# Patient Record
Sex: Male | Born: 1955 | Race: White | Hispanic: No | Marital: Married | Smoking: Never smoker
Health system: Southern US, Community
[De-identification: ages and names within clinical notes are randomized; demographics above are authoritative.]

## PROBLEM LIST (undated history)

## (undated) DIAGNOSIS — J45909 Unspecified asthma, uncomplicated: Secondary | ICD-10-CM

## (undated) DIAGNOSIS — R7989 Other specified abnormal findings of blood chemistry: Secondary | ICD-10-CM

## (undated) DIAGNOSIS — R011 Cardiac murmur, unspecified: Secondary | ICD-10-CM

## (undated) DIAGNOSIS — R945 Abnormal results of liver function studies: Secondary | ICD-10-CM

## (undated) DIAGNOSIS — D489 Neoplasm of uncertain behavior, unspecified: Secondary | ICD-10-CM

## (undated) DIAGNOSIS — I10 Essential (primary) hypertension: Secondary | ICD-10-CM

## (undated) HISTORY — DX: Neoplasm of uncertain behavior, unspecified: D48.9

## (undated) HISTORY — DX: Other specified abnormal findings of blood chemistry: R79.89

## (undated) HISTORY — DX: Abnormal results of liver function studies: R94.5

## (undated) HISTORY — PX: OTHER SURGICAL HISTORY: SHX169

## (undated) HISTORY — DX: Cardiac murmur, unspecified: R01.1

## (undated) HISTORY — DX: Unspecified asthma, uncomplicated: J45.909

## (undated) HISTORY — DX: Essential (primary) hypertension: I10

---

## 1992-03-11 HISTORY — PX: VASECTOMY: SHX75

## 2010-03-11 LAB — HM COLONOSCOPY

## 2015-03-22 LAB — BASIC METABOLIC PANEL
BUN: 22 mg/dL — AB (ref 4–21)
CREATININE: 0.9 mg/dL (ref 0.6–1.3)
Glucose: 92 mg/dL
Potassium: 4.4 mmol/L (ref 3.4–5.3)
Sodium: 129 mmol/L — AB (ref 137–147)

## 2015-03-22 LAB — HEPATIC FUNCTION PANEL
ALK PHOS: 72 U/L (ref 25–125)
BILIRUBIN, TOTAL: 0.5 mg/dL

## 2015-03-22 LAB — PSA: PSA: 0.36

## 2015-03-22 LAB — TSH: TSH: 1.86 u[IU]/mL (ref 0.41–5.90)

## 2015-04-17 LAB — HEPATIC FUNCTION PANEL
ALT: 32 U/L (ref 10–40)
AST: 26 U/L (ref 14–40)
Alkaline Phosphatase: 59 U/L (ref 25–125)
Bilirubin, Total: 0.5 mg/dL

## 2015-04-17 LAB — BASIC METABOLIC PANEL
BUN: 17 mg/dL (ref 4–21)
Creatinine: 0.8 mg/dL (ref 0.6–1.3)
GLUCOSE: 101 mg/dL
Potassium: 4.1 mmol/L (ref 3.4–5.3)
SODIUM: 134 mmol/L — AB (ref 137–147)

## 2015-04-17 LAB — LIPID PANEL
CHOLESTEROL: 202 mg/dL — AB (ref 0–200)
HDL: 66 mg/dL (ref 35–70)
LDL Cholesterol: 126 mg/dL
LDL/HDL RATIO: 3.1
Triglycerides: 49 mg/dL (ref 40–160)

## 2015-05-01 ENCOUNTER — Other Ambulatory Visit: Payer: Self-pay

## 2015-05-01 ENCOUNTER — Encounter: Payer: Self-pay | Admitting: Internal Medicine

## 2015-05-02 ENCOUNTER — Ambulatory Visit (INDEPENDENT_AMBULATORY_CARE_PROVIDER_SITE_OTHER): Payer: BLUE CROSS/BLUE SHIELD | Admitting: Internal Medicine

## 2015-05-02 ENCOUNTER — Encounter: Payer: Self-pay | Admitting: Internal Medicine

## 2015-05-02 VITALS — BP 120/76 | HR 63 | Temp 97.8°F | Ht 72.0 in | Wt 246.5 lb

## 2015-05-02 DIAGNOSIS — Z0000 Encounter for general adult medical examination without abnormal findings: Secondary | ICD-10-CM

## 2015-05-02 DIAGNOSIS — J4520 Mild intermittent asthma, uncomplicated: Secondary | ICD-10-CM

## 2015-05-02 DIAGNOSIS — I10 Essential (primary) hypertension: Secondary | ICD-10-CM | POA: Diagnosis not present

## 2015-05-02 DIAGNOSIS — Z09 Encounter for follow-up examination after completed treatment for conditions other than malignant neoplasm: Secondary | ICD-10-CM

## 2015-05-02 DIAGNOSIS — Z7689 Persons encountering health services in other specified circumstances: Secondary | ICD-10-CM

## 2015-05-02 MED ORDER — ZALEPLON 10 MG PO CAPS: 10.0000 mg | ORAL_CAPSULE | Freq: Every evening | ORAL | Status: DC | PRN

## 2015-05-02 NOTE — Patient Instructions (Signed)
GO TO THE LAB IN 3 MONTHS A BMP @ [facility]  GO TO THE FRONT DESK  Schedule a complete physical exam to be done in 1 year Please be fasting     Check the  blood pressure 2 or 3 times a   Week  Be sure your blood pressure is between 110/65 and  145/85. If it is consistently higher or lower, let me know

## 2015-05-02 NOTE — Progress Notes (Signed)
Subjective:    Patient ID: Daniel Lyons, male    DOB: 1955-11-04, 60 y.o.   MRN: [id number]  DOS:  05/02/2015 Type of visit - description : New patient, transferring from [facility]  Interval history: Feeling well now but in the last few months had a number of issues: Developed a URI November 2016, was treated with a Z-Pak, prednisone and eventually in January 2017 with Augmentin. A chest x-ray was negative, he eventually got better. In the process, BP was noted to be elevated in the 150s/100 several times. Hydrochlorothiazide was switched to chlorthalidone. Good compliance, tolerance and BPs are now much better. Also, LFTs were elevated 3 normal values, in the context of being sick and taking a number of medications, subsequently LFTs were checked and they were normal. Again at this point he is feeling well.   Review of Systems   denies fever chills No cough or wheezing No nausea, vomiting, diarrhea.  Past Medical History  Diagnosis Date  . Hypertension   . Elevated LFTs   . Asthmatic bronchitis     "long ago"  . Giant cell tumor     removal  . Heart murmur     frequent PVCs    Past Surgical History  Procedure Laterality Date  . Vasectomy  1994  . Finger tumor removal      L index, giant cell tumor     Family History  Problem Relation Age of Onset  . Prostate cancer Father 5  . Prostate cancer Paternal Uncle   . CAD Neg Hx   . Diabetes Neg Hx   . Colon cancer Neg Hx   . Pancreatic cancer Father     Social History   Social History  . Marital Status: Married    Spouse Name: N/A  . Number of Children: 3  . Years of Education: N/A   Occupational History  . 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 Smoking status: Never Smoker   . Smokeless tobacco: Not on file  . Alcohol Use: 0.0 oz/week    0 Standard drinks or equivalent per week     Comment: 12 oz wine daily  . Drug Use: No  . Sexual Activity: Not on file   Other Topics  Concern  . Not on file   Social History Narrative        Medication List       This list is accurate as of: 05/02/15 11:59 PM.  Always use your most recent med list.               chlorthalidone 25 MG tablet  Commonly known as:  HYGROTON  Take 25 mg by mouth every morning.     fosinopril 20 MG tablet  Commonly known as:  MONOPRIL  Take 2 tablets by mouth daily.     Magnesium 250 MG Tabs  Take 1 tablet by mouth daily.     zaleplon 10 MG capsule  Commonly known as:  SONATA  Take 1 capsule (10 mg total) by mouth at bedtime as needed for sleep.           Objective:   Physical Exam BP 120/76 mmHg  Pulse 63  Temp(Src) 97.8 F (36.6 C) (Oral)  Ht 6' (1.829 m)  Wt 246 lb 8 oz (111.812 kg)  BMI 33.42 kg/m2  SpO2 98% General:   Well developed, well nourished . NAD.  HEENT:  Normocephalic . Face symmetric, atraumatic  Lungs:  CTA B Normal respiratory effort, no intercostal retractions, no accessory muscle use. Heart: RRR,  no murmur.  no pretibial edema bilaterally  Abdomen:  Not distended, soft, non-tender. No rebound or rigidity.  Skin: Not pale. Not jaundice Neurologic:  alert & oriented X3.  Speech normal, gait appropriate for age and unassisted Psych--  Cognition and judgment appear intact.  Cooperative with normal attention span and concentration.  Behavior appropriate. No anxious or depressed appearing.    Assessment & Plan:   Assessment HTN -on fosinopril for a while, HCTZ change to chlorthalidone 03-2015. H/o RAD   H/o freq PVCs, dx remotely, holter in the 90s  Labs reviewed today: 03/22/2015: PSA 0.3, TSH 1.8, potassium 4.4, creatinine 0.9, AST 132, ALT 206 04/17/2015: Cholesterol 202, LDL 126. HDL 66 Sodium 134 low, chloride 94 low, creatinine 0.8, potassium 4.1. AST 26, ALT 32 testosterone 456, free testosterone 62 normal  PLAN Transferring from Dr.Jobe @ [facility] HTN:on fosinopril for a while, HCTZ changed to chlorthalidone 03-2015  due to elevated BP, subsequent BMP satisfactory except for mild hyponatremia, BPs now are very good. We'll continue with present care, check a BMP-Mg in 3 months Reactive airway disease: Recently developed a URI, s/p 2 rounds of antibiotics and prednisone >>> better. FH prostate cancer: Most recent PSA normal. Primary care: Td 07/2014 Colonoscopy 2012 reportedly normal Had a flu shot.  RTC 1 year  Today, I spent more than  30 min with the patient: >50% of the time counseling regards chronic medical issues. Also: -reviewing the chart and labs ordered by other providers

## 2015-05-02 NOTE — Progress Notes (Signed)
Pre visit review using our clinic review tool, if applicable. No additional management support is needed unless otherwise documented below in the visit note. 

## 2015-05-03 ENCOUNTER — Encounter: Payer: Self-pay | Admitting: Internal Medicine

## 2015-05-03 DIAGNOSIS — I10 Essential (primary) hypertension: Secondary | ICD-10-CM | POA: Insufficient documentation

## 2015-05-03 DIAGNOSIS — J45909 Unspecified asthma, uncomplicated: Secondary | ICD-10-CM | POA: Insufficient documentation

## 2015-05-03 DIAGNOSIS — Z09 Encounter for follow-up examination after completed treatment for conditions other than malignant neoplasm: Secondary | ICD-10-CM | POA: Insufficient documentation

## 2015-05-03 DIAGNOSIS — Z0000 Encounter for general adult medical examination without abnormal findings: Secondary | ICD-10-CM | POA: Insufficient documentation

## 2015-05-03 NOTE — Assessment & Plan Note (Signed)
Transferring from [place] @ [facility] HTN:on fosinopril for a while, HCTZ changed to chlorthalidone 03-2015 due to elevated BP, subsequent BMP satisfactory except for mild hyponatremia, BPs now are very good. We'll continue with present care, check a BMP-Mg in 3 months Reactive airway disease: Recently developed a URI, s/p 2 rounds of antibiotics and prednisone >>> better. FH prostate cancer: Most recent PSA normal. Primary care: Td 07/2014 Colonoscopy 2012 reportedly normal Had a flu shot.  RTC 1 year

## 2015-05-03 NOTE — Assessment & Plan Note (Signed)
FH prostate cancer: Most recent PSA normal. Primary care: Td 07/2014 Colonoscopy 2012 reportedly normal Had a flu shot.

## 2015-06-13 ENCOUNTER — Ambulatory Visit (INDEPENDENT_AMBULATORY_CARE_PROVIDER_SITE_OTHER): Payer: BLUE CROSS/BLUE SHIELD | Admitting: Physician Assistant

## 2015-06-13 ENCOUNTER — Encounter: Payer: Self-pay | Admitting: Physician Assistant

## 2015-06-13 VITALS — BP 150/90 | HR 81 | Temp 98.1°F | Ht 72.0 in | Wt 245.0 lb

## 2015-06-13 DIAGNOSIS — B9689 Other specified bacterial agents as the cause of diseases classified elsewhere: Secondary | ICD-10-CM | POA: Insufficient documentation

## 2015-06-13 DIAGNOSIS — J019 Acute sinusitis, unspecified: Secondary | ICD-10-CM

## 2015-06-13 MED ORDER — AMOXICILLIN-POT CLAVULANATE 875-125 MG PO TABS: 1.0000 | ORAL_TABLET | Freq: Two times a day (BID) | ORAL | Status: DC

## 2015-06-13 NOTE — Assessment & Plan Note (Signed)
Rx Augmentin.  Increase fluids.  Rest.  Saline nasal spray.  Probiotic.  Mucinex as directed.  Humidifier in bedroom. Continue allergy medications.  Call or return to clinic if symptoms are not improving.  

## 2015-06-13 NOTE — Progress Notes (Signed)
Pre visit review using our clinic review tool, if applicable. No additional management support is needed unless otherwise documented below in the visit note. 

## 2015-06-13 NOTE — Progress Notes (Signed)
Patient presents to clinic today c/o 8 days of worsening sinus pressure, ear pressure, thick rhinorrhea and sore throat. Endorses facial pain and tooth pain with significant L sided sinus pain over the past couple of days. Denies fever, chest congestion. Has taken Motrin without much relief of symptoms.   Past Medical History  Diagnosis Date  . Hypertension   . Elevated LFTs   . Reactive airway disease     "long ago"  . Giant cell tumor     removal  . Heart murmur     frequent PVCs    Current Outpatient Prescriptions on File Prior to Visit  Medication Sig Dispense Refill  . chlorthalidone (HYGROTON) 25 MG tablet Take 25 mg by mouth every morning.    . fosinopril (MONOPRIL) 20 MG tablet Take 2 tablets by mouth daily.    . Magnesium 250 MG TABS Take 1 tablet by mouth daily.    . zaleplon (SONATA) 10 MG capsule Take 1 capsule (10 mg total) by mouth at bedtime as needed for sleep. 15 capsule 0   No current facility-administered medications on file prior to visit.    No Known Allergies  Family History  Problem Relation Age of Onset  . Prostate cancer Father 25  . Prostate cancer Paternal Uncle   . CAD Neg Hx   . Diabetes Neg Hx   . Colon cancer Neg Hx   . Pancreatic cancer Father     Social History   Social History  . Marital Status: Married    Spouse Name: N/A  . Number of Children: 3  . 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 Smoking status: Never Smoker   . Smokeless tobacco: None  . Alcohol Use: 0.0 oz/week    0 Standard drinks or equivalent per week     Comment: 12 oz wine daily  . Drug Use: No  . Sexual Activity: Not Asked   Other Topics Concern  . None   Social History Narrative   Review of Systems - See HPI.  All other ROS are negative.  BP 150/90 mmHg  Pulse 81  Temp(Src) 98.1 F (36.7 C) (Oral)  Ht 6' (1.829 m)  Wt 245 lb (111.131 kg)  BMI 33.22 kg/m2  SpO2  98%  Physical Exam  Constitutional: He is oriented to person, place, and time and well-developed, well-nourished, and in no distress.  HENT:  Head: Normocephalic and atraumatic.  Right Ear: Tympanic membrane normal.  Left Ear: Tympanic membrane normal.  Nose: Mucosal edema and rhinorrhea present. Left sinus exhibits maxillary sinus tenderness and frontal sinus tenderness.  Mouth/Throat: Uvula is midline, oropharynx is clear and moist and mucous membranes are normal.  Eyes: Conjunctivae are normal.  Neck: Neck supple.  Cardiovascular: Normal rate, regular rhythm, normal heart sounds and intact distal pulses.   Pulmonary/Chest: Effort normal and breath sounds normal.  Lymphadenopathy:    He has no cervical adenopathy.  Neurological: He is alert and oriented to person, place, and time.  Skin: Skin is warm and dry. No rash noted.  Psychiatric: Affect normal.  Vitals reviewed.   Recent Results (from the past 2160 hour(s))  Basic metabolic panel     Status: Abnormal   Collection Time: 03/22/15 12:00 AM  Result Value Ref Range   Glucose 92 mg/dL   BUN 22 (A) 4 - 21 mg/dL   Creatinine 0.9 0.6 - 1.3 mg/dL  Potassium 4.4 3.4 - 5.3 mmol/L   Sodium 129 (A) 137 - 147 mmol/L  Hepatic function panel     Status: None   Collection Time: 03/22/15 12:00 AM  Result Value Ref Range   Alkaline Phosphatase 72 25 - 125 U/L   Bilirubin, Total 0.5 mg/dL  PSA     Status: None   Collection Time: 03/22/15 12:00 AM  Result Value Ref Range   PSA 0.36   TSH     Status: None   Collection Time: 03/22/15 12:00 AM  Result Value Ref Range   TSH 1.86 0.41 - 5.90 uIU/mL  Basic metabolic panel     Status: Abnormal   Collection Time: 04/17/15 12:00 AM  Result Value Ref Range   Glucose 101 mg/dL   BUN 17 4 - 21 mg/dL   Creatinine 0.8 0.6 - 1.3 mg/dL   Potassium 4.1 3.4 - 5.3 mmol/L   Sodium 134 (A) 137 - 147 mmol/L  Lipid panel     Status: Abnormal   Collection Time: 04/17/15 12:00 AM  Result Value Ref  Range   LDl/HDL Ratio 3.1    Triglycerides 49 40 - 160 mg/dL   Cholesterol 202 (A) 0 - 200 mg/dL   HDL 66 35 - 70 mg/dL   LDL Cholesterol 126 mg/dL  Hepatic function panel     Status: None   Collection Time: 04/17/15 12:00 AM  Result Value Ref Range   Alkaline Phosphatase 59 25 - 125 U/L   ALT 32 10 - 40 U/L   AST 26 14 - 40 U/L   Bilirubin, Total 0.5 mg/dL    Assessment/Plan: Acute bacterial sinusitis Rx Augmentin.  Increase fluids.  Rest.  Saline nasal spray.  Probiotic.  Mucinex as directed.  Humidifier in bedroom. Continue allergy medications.  Call or return to clinic if symptoms are not improving.

## 2015-06-13 NOTE — Patient Instructions (Signed)
Please take antibiotic as directed.  Increase fluid intake.  Use Saline nasal spray.  Take a daily multivitamin. Continue allergy medication regimen.  Place a humidifier in the bedroom.  Please call or return clinic if symptoms are not improving.  Sinusitis Sinusitis is redness, soreness, and swelling (inflammation) of the paranasal sinuses. Paranasal sinuses are air pockets within the bones of your face (beneath the eyes, the middle of the forehead, or above the eyes). In healthy paranasal sinuses, mucus is able to drain out, and air is able to circulate through them by way of your nose. However, when your paranasal sinuses are inflamed, mucus and air can become trapped. This can allow bacteria and other germs to grow and cause infection. Sinusitis can develop quickly and last only a short time (acute) or continue over a long period (chronic). Sinusitis that lasts for more than 12 weeks is considered chronic.  CAUSES  Causes of sinusitis include:  Allergies.  Structural abnormalities, such as displacement of the cartilage that separates your nostrils (deviated septum), which can decrease the air flow through your nose and sinuses and affect sinus drainage.  Functional abnormalities, such as when the small hairs (cilia) that line your sinuses and help remove mucus do not work properly or are not present. SYMPTOMS  Symptoms of acute and chronic sinusitis are the same. The primary symptoms are pain and pressure around the affected sinuses. Other symptoms include:  Upper toothache.  Earache.  Headache.  Bad breath.  Decreased sense of smell and taste.  A cough, which worsens when you are lying flat.  Fatigue.  Fever.  Thick drainage from your nose, which often is green and may contain pus (purulent).  Swelling and warmth over the affected sinuses. DIAGNOSIS  Your caregiver will perform a physical exam. During the exam, your caregiver may:  Look in your nose for signs of abnormal  growths in your nostrils (nasal polyps).  Tap over the affected sinus to check for signs of infection.  View the inside of your sinuses (endoscopy) with a special imaging device with a light attached (endoscope), which is inserted into your sinuses. If your caregiver suspects that you have chronic sinusitis, one or more of the following tests may be recommended:  Allergy tests.  Nasal culture A sample of mucus is taken from your nose and sent to a lab and screened for bacteria.  Nasal cytology A sample of mucus is taken from your nose and examined by your caregiver to determine if your sinusitis is related to an allergy. TREATMENT  Most cases of acute sinusitis are related to a viral infection and will resolve on their own within 10 days. Sometimes medicines are prescribed to help relieve symptoms (pain medicine, decongestants, nasal steroid sprays, or saline sprays).  However, for sinusitis related to a bacterial infection, your caregiver will prescribe antibiotic medicines. These are medicines that will help kill the bacteria causing the infection.  Rarely, sinusitis is caused by a fungal infection. In theses cases, your caregiver will prescribe antifungal medicine. For some cases of chronic sinusitis, surgery is needed. Generally, these are cases in which sinusitis recurs more than 3 times per year, despite other treatments. HOME CARE INSTRUCTIONS   Drink plenty of water. Water helps thin the mucus so your sinuses can drain more easily.  Use a humidifier.  Inhale steam 3 to 4 times a day (for example, sit in the bathroom with the shower running).  Apply a warm, moist washcloth to your face 3 to  4 times a day, or as directed by your caregiver.  Use saline nasal sprays to help moisten and clean your sinuses.  Take over-the-counter or prescription medicines for pain, discomfort, or fever only as directed by your caregiver. SEEK IMMEDIATE MEDICAL CARE IF:  You have increasing pain or  severe headaches.  You have nausea, vomiting, or drowsiness.  You have swelling around your face.  You have vision problems.  You have a stiff neck.  You have difficulty breathing. MAKE SURE YOU:   Understand these instructions.  Will watch your condition.  Will get help right away if you are not doing well or get worse. Document Released: 02/25/2005 Document Revised: 05/20/2011 Document Reviewed: 03/12/2011 [facility] Patient Information 2014 [place], [place].

## 2015-06-13 NOTE — Progress Notes (Deleted)
Pre visit review using our clinic review tool, if applicable. No additional management support is needed unless otherwise documented below in the visit note. 

## 2015-09-18 ENCOUNTER — Telehealth: Payer: Self-pay | Admitting: Internal Medicine

## 2015-09-18 DIAGNOSIS — L281 Prurigo nodularis: Secondary | ICD-10-CM | POA: Diagnosis not present

## 2015-09-18 DIAGNOSIS — D225 Melanocytic nevi of trunk: Secondary | ICD-10-CM | POA: Diagnosis not present

## 2015-09-18 DIAGNOSIS — D2262 Melanocytic nevi of left upper limb, including shoulder: Secondary | ICD-10-CM | POA: Diagnosis not present

## 2015-09-18 DIAGNOSIS — D485 Neoplasm of uncertain behavior of skin: Secondary | ICD-10-CM | POA: Diagnosis not present

## 2015-09-18 DIAGNOSIS — D2261 Melanocytic nevi of right upper limb, including shoulder: Secondary | ICD-10-CM | POA: Diagnosis not present

## 2015-09-18 NOTE — Telephone Encounter (Signed)
Relation to [id number] Call back [place]: [place] [address] - [place], [address] [phone] (Phone) [phone] (Fax)         Reason for call:  Patient requesting a refill chlorthalidone (HYGROTON) 25 MG tablet & fosinopril (MONOPRIL) 20 MG tablet

## 2015-09-18 NOTE — Telephone Encounter (Signed)
New Pt established on 05/02/2015, okay to refill medications?

## 2015-09-19 ENCOUNTER — Other Ambulatory Visit: Payer: Self-pay | Admitting: Internal Medicine

## 2015-09-19 MED ORDER — FOSINOPRIL SODIUM 20 MG PO TABS: 40.0000 mg | ORAL_TABLET | Freq: Every day | ORAL | Status: DC

## 2015-09-19 MED ORDER — CHLORTHALIDONE 25 MG PO TABS: 25.0000 mg | ORAL_TABLET | ORAL | Status: DC

## 2015-09-19 NOTE — Telephone Encounter (Signed)
Rx's sent. LMOM informing Pt that 30 day supply of Rx's were sent to pharmacy but no further refills can be given until labs from [place] in 04/2015 are completed. Instructed him to call and schedule lab appt.

## 2015-09-19 NOTE — Telephone Encounter (Signed)
Okay  one month, no RFs. Needs labs @ [facility] (see last office visit) Further refills based on labs

## 2015-09-22 ENCOUNTER — Other Ambulatory Visit: Payer: BLUE CROSS/BLUE SHIELD

## 2015-09-28 ENCOUNTER — Other Ambulatory Visit: Payer: Self-pay

## 2015-09-28 DIAGNOSIS — I10 Essential (primary) hypertension: Secondary | ICD-10-CM

## 2015-09-29 ENCOUNTER — Other Ambulatory Visit: Payer: BLUE CROSS/BLUE SHIELD

## 2015-09-29 ENCOUNTER — Other Ambulatory Visit (INDEPENDENT_AMBULATORY_CARE_PROVIDER_SITE_OTHER): Payer: BLUE CROSS/BLUE SHIELD

## 2015-09-29 DIAGNOSIS — I10 Essential (primary) hypertension: Secondary | ICD-10-CM

## 2015-09-29 LAB — BASIC METABOLIC PANEL
BUN: 20 mg/dL (ref 6–23)
CALCIUM: 9.7 mg/dL (ref 8.4–10.5)
CHLORIDE: 94 meq/L — AB (ref 96–112)
CO2: 29 meq/L (ref 19–32)
CREATININE: 0.93 mg/dL (ref 0.40–1.50)
GFR: 88.56 mL/min (ref >60.00)
Glucose, Bld: 97 mg/dL (ref 70–99)
Potassium: 4.1 mEq/L (ref 3.5–5.1)
Sodium: 133 mEq/L — ABNORMAL LOW (ref 135–145)

## 2015-09-29 LAB — MAGNESIUM: Magnesium: 2.2 mg/dL (ref 1.5–2.5)

## 2015-10-06 ENCOUNTER — Other Ambulatory Visit: Payer: Self-pay | Admitting: Internal Medicine

## 2015-11-28 DIAGNOSIS — D225 Melanocytic nevi of trunk: Secondary | ICD-10-CM | POA: Diagnosis not present

## 2015-11-28 DIAGNOSIS — L988 Other specified disorders of the skin and subcutaneous tissue: Secondary | ICD-10-CM | POA: Diagnosis not present

## 2015-11-28 DIAGNOSIS — D485 Neoplasm of uncertain behavior of skin: Secondary | ICD-10-CM | POA: Diagnosis not present

## 2015-12-12 DIAGNOSIS — Z23 Encounter for immunization: Secondary | ICD-10-CM | POA: Diagnosis not present

## 2016-03-15 LAB — BASIC METABOLIC PANEL
BUN: 16 mg/dL (ref 4–21)
Creatinine: 0.9 mg/dL (ref 0.6–1.3)
GLUCOSE: 87 mg/dL
POTASSIUM: 3.8 mmol/L (ref 3.4–5.3)
SODIUM: 132 mmol/L — AB (ref 137–147)

## 2016-03-15 LAB — PSA: PSA: 0.5

## 2016-03-22 ENCOUNTER — Ambulatory Visit (INDEPENDENT_AMBULATORY_CARE_PROVIDER_SITE_OTHER): Payer: BLUE CROSS/BLUE SHIELD | Admitting: Internal Medicine

## 2016-03-22 ENCOUNTER — Encounter: Payer: Self-pay | Admitting: Internal Medicine

## 2016-03-22 VITALS — BP 122/74 | HR 76 | Temp 98.2°F | Resp 14 | Ht 72.0 in | Wt 242.1 lb

## 2016-03-22 DIAGNOSIS — Z114 Encounter for screening for human immunodeficiency virus [HIV]: Secondary | ICD-10-CM

## 2016-03-22 DIAGNOSIS — Z1159 Encounter for screening for other viral diseases: Secondary | ICD-10-CM | POA: Diagnosis not present

## 2016-03-22 DIAGNOSIS — Z0000 Encounter for general adult medical examination without abnormal findings: Secondary | ICD-10-CM | POA: Diagnosis not present

## 2016-03-22 LAB — CBC WITH DIFFERENTIAL/PLATELET
BASOS PCT: 0.5 % (ref 0.0–3.0)
Basophils Absolute: 0 10*3/uL (ref 0.0–0.1)
EOS ABS: 0.3 10*3/uL (ref 0.0–0.7)
Eosinophils Relative: 6.6 % — ABNORMAL HIGH (ref 0.0–5.0)
HEMATOCRIT: 41 % (ref 39.0–52.0)
Hemoglobin: 14.1 g/dL (ref 13.0–17.0)
LYMPHS ABS: 0.9 10*3/uL (ref 0.7–4.0)
Lymphocytes Relative: 22.9 % (ref 12.0–46.0)
MCHC: 34.5 g/dL (ref 30.0–36.0)
MCV: 88.2 fl (ref 78.0–100.0)
MONO ABS: 0.7 10*3/uL (ref 0.1–1.0)
Monocytes Relative: 18.6 % — ABNORMAL HIGH (ref 3.0–12.0)
NEUTROS ABS: 2 10*3/uL (ref 1.4–7.7)
NEUTROS PCT: 51.4 % (ref 43.0–77.0)
PLATELETS: 276 10*3/uL (ref 150.0–400.0)
RBC: 4.64 Mil/uL (ref 4.22–5.81)
RDW: 12.2 % (ref 11.5–15.5)
WBC: 3.8 10*3/uL — ABNORMAL LOW (ref 4.0–10.5)

## 2016-03-22 LAB — FOLATE: Folate: 22.4 ng/mL (ref >5.9)

## 2016-03-22 LAB — URINALYSIS, ROUTINE W REFLEX MICROSCOPIC
Bilirubin Urine: NEGATIVE
Hgb urine dipstick: NEGATIVE
Ketones, ur: NEGATIVE
Leukocytes, UA: NEGATIVE
Nitrite: NEGATIVE
PH: 7.5 (ref 5.0–8.0)
RBC / HPF: NONE SEEN (ref 0–?)
SPECIFIC GRAVITY, URINE: 1.015 (ref 1.000–1.030)
Total Protein, Urine: NEGATIVE
Urine Glucose: NEGATIVE
Urobilinogen, UA: 0.2 (ref 0.0–1.0)

## 2016-03-22 LAB — LIPID PANEL
CHOL/HDL RATIO: 3
CHOLESTEROL: 179 mg/dL (ref 0–200)
HDL: 54.2 mg/dL (ref >39.00)
LDL Cholesterol: 114 mg/dL — ABNORMAL HIGH (ref 0–99)
NonHDL: 125.13
TRIGLYCERIDES: 55 mg/dL (ref 0.0–149.0)
VLDL: 11 mg/dL (ref 0.0–40.0)

## 2016-03-22 LAB — AST: AST: 23 U/L (ref 0–37)

## 2016-03-22 LAB — TSH: TSH: 1.1 u[IU]/mL (ref 0.35–4.50)

## 2016-03-22 LAB — HEPATITIS C ANTIBODY: HCV Ab: NEGATIVE

## 2016-03-22 LAB — HIV ANTIBODY (ROUTINE TESTING W REFLEX): HIV: NONREACTIVE

## 2016-03-22 LAB — ALT: ALT: 29 U/L (ref 0–53)

## 2016-03-22 LAB — VITAMIN B12: Vitamin B-12: 321 pg/mL (ref 211–911)

## 2016-03-22 MED ORDER — SILDENAFIL CITRATE 20 MG PO TABS: 60.0000 mg | ORAL_TABLET | Freq: Every evening | ORAL | 3 refills | Status: DC | PRN

## 2016-03-22 NOTE — Assessment & Plan Note (Signed)
Td 07/2014; had a flu shot CCS: Colonoscopy 2012 reportedly normal FH Prostate ca-- last  PSA 0.3  (03-2015) Labs last week: BMP -- normal creatinine, Na 132 slightly low. PSA, FSH, LH normal. Free and total testosterone within normal, free testosterone value lower than last year. Will check a CBC, folic acid, vitamin D, B12, TSH, LFTs and FLP. Cont healthy lifestyle

## 2016-03-22 NOTE — Progress Notes (Signed)
Pre visit review using our clinic review tool, if applicable. No additional management support is needed unless otherwise documented below in the visit note. 

## 2016-03-22 NOTE — Progress Notes (Signed)
Subjective:    Patient ID: Daniel Lyons, male    DOB: 1955-06-28, 61 y.o.   MRN: [id number]  DOS:  03/22/2016 Type of visit - description : cpx Interval history: Has few concerns, see review of systems   Review of Systems -About 4 months ago he started to feel tired, he hired a Physiological scientist and started a formal exercise program several times a week, however by the  end of the week he was feeling so fatigued that had to scale down the amount of exercise he does . Has changed his diet, eating approximately 1700 cal/day. No weight loss but has lose some inches in the waist. -Also, noted that after ejaculation he is able to get more fluid if he pushes in the perineum. No blood in the semen, no other GU symptoms. In the last few weeks has noted that erections don't last long enough. Denies any particular stress, no new medicines (changes HCTZ to chlorthalidone a year ago). Denies chest pain, claudication - got labs last week. BMP satisfactory, FSH, LH and PSA normal. Testosterone within normal. Free testosterone at the lower level of normal but interestingly is less than previous levels a year ago.  -Occasionally snores when he sleeps in his back, not feeling sleepy -No anxiety, depression or stress. -Otherwise  13-system ROS  is negative   Past Medical History:  Diagnosis Date  . Elevated LFTs   . Giant cell tumor    removal  . Heart murmur    frequent PVCs  . Hypertension   . Reactive airway disease    "long ago"    Past Surgical History:  Procedure Laterality Date  . finger tumor removal     L index, giant cell tumor   . VASECTOMY  1994    Social History   Social History  . Marital status: Married    Spouse name: N/A  . Number of children: 3  . Years of education: N/A   Occupational History  . 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 Smoking status: Never Smoker  . Smokeless tobacco: Never Used  . Alcohol use 0.0 oz/week     Comment:  12 oz wine daily  . Drug use: No  . Sexual activity: Not on file   Other Topics Concern  . Not on file   Social History Narrative  . No narrative on file     Family History  Problem Relation Age of Onset  . Prostate cancer Father 26  . Pancreatic cancer Father   . Prostate cancer Paternal Uncle   . CAD Neg Hx   . Diabetes Neg Hx   . Colon cancer Neg Hx      Allergies as of 03/22/2016   No Known Allergies     Medication List       Accurate as of 03/22/16 11:59 PM. Always use your most recent med list.          chlorthalidone 25 MG tablet Commonly known as:  HYGROTON Take 1 tablet (25 mg total) by mouth daily.   fosinopril 20 MG tablet Commonly known as:  MONOPRIL Take 2 tablets (40 mg total) by mouth daily.   sildenafil 20 MG tablet Commonly known as:  REVATIO Take 3-4 tablets (60-80 mg total) by mouth at bedtime as needed.   VITAMIN D3 PO Take 1 capsule by mouth daily.   zaleplon 10 MG capsule Commonly known as:  SONATA Take 1 capsule (  10 mg total) by mouth at bedtime as needed for sleep.          Objective:   Physical Exam BP 122/74 (BP Location: Left Arm, Patient Position: Sitting, Cuff Size: Normal)   Pulse 76   Temp 98.2 F (36.8 C) (Oral)   Resp 14   Ht 6' (1.829 m)   Wt 242 lb 2 oz (109.8 kg)   SpO2 98%   BMI 32.84 kg/m   General:   Well developed, well nourished . NAD.  Neck: No  thyromegaly  HEENT:  Normocephalic . Face symmetric, atraumatic Lungs:  CTA B Normal respiratory effort, no intercostal retractions, no accessory muscle use. Heart: RRR,  no murmur.  No pretibial edema bilaterally  Abdomen:  Not distended, soft, non-tender. No rebound or rigidity.   Lower extremities:   normal femoral pulses Rectal:  External abnormalities: none. Normal sphincter tone. No rectal masses or tenderness.  Stool brown  GU: Normal testicles-symmetric Prostate: Prostate gland firm and smooth, no enlargement, nodularity, tenderness, mass,  asymmetry or induration.  Skin: Exposed areas without rash. Not pale. Not jaundice Neurologic:  alert & oriented X3.  Speech normal, gait appropriate for age and unassisted Strength symmetric and appropriate for age.  Psych: Cognition and judgment appear intact.  Cooperative with normal attention span and concentration.  Behavior appropriate. No anxious or depressed appearing.    Assessment & Plan:   Assessment HTN -on fosinopril for a while, HCTZ change to chlorthalidone 03-2015. 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 ED: No anxiety or depression, no new medications except for chlorthalidone for one year. Normal vascular exam. Recommend a trial with Viagra Abnormal ejaculation? GU exam normal, recommend observation. Fatigue: Snores but denies feeling sleepy. Will check a CBC, folic acid, TSH, vitamin D, B12, LFTs. Reassess in 3 months. Consider further eval. Patient concerned about his testosterone results, free testosterone value within normal but lower than previous years. He wonders about treatment, I recommend observation for now, offered Endo referral. Will call if interested RTC 4 months

## 2016-03-22 NOTE — Patient Instructions (Signed)
GO TO THE LAB : Get the blood work     GO TO THE FRONT DESK Schedule your next appointment for a  Check up in 4 months   

## 2016-03-24 NOTE — Assessment & Plan Note (Signed)
HTN: Seems well-controlled on Monopril and chlorthalidone. ED: No anxiety or depression, no new medications except for chlorthalidone for one year. Normal vascular exam. Recommend a trial with Viagra Abnormal ejaculation? GU exam normal, recommend observation. Fatigue: Snores but denies feeling sleepy. Will check a CBC, folic acid, TSH, vitamin D, B12, LFTs. Reassess in 3 months. Consider further eval. Patient concerned about his testosterone results, free testosterone value within normal but lower than previous years. He wonders about treatment, I recommend observation for now, offered Endo referral. Will call if interested RTC 4 months

## 2016-03-26 LAB — VITAMIN D 1,25 DIHYDROXY
VITAMIN D3 1, 25 (OH): 51 pg/mL
Vitamin D 1, 25 (OH)2 Total: 51 pg/mL (ref 18–72)
Vitamin D2 1, 25 (OH)2: <8 pg/mL

## 2016-03-28 ENCOUNTER — Encounter: Payer: Self-pay | Admitting: Internal Medicine

## 2016-03-28 DIAGNOSIS — R7989 Other specified abnormal findings of blood chemistry: Secondary | ICD-10-CM

## 2016-03-29 NOTE — Telephone Encounter (Signed)
Referral placed.

## 2016-03-29 NOTE — Telephone Encounter (Signed)
Enter a endocrinology referral DX low testosterone  Received: Today  [place], MD  Damita Dunnings, CMA

## 2016-04-09 ENCOUNTER — Other Ambulatory Visit: Payer: Self-pay | Admitting: Internal Medicine

## 2016-04-11 ENCOUNTER — Encounter: Payer: Self-pay | Admitting: Internal Medicine

## 2016-04-11 ENCOUNTER — Other Ambulatory Visit: Payer: Self-pay | Admitting: Internal Medicine

## 2016-04-11 MED ORDER — ALBUTEROL SULFATE HFA 108 (90 BASE) MCG/ACT IN AERS: 2.0000 | INHALATION_SPRAY | Freq: Four times a day (QID) | RESPIRATORY_TRACT | 1 refills | Status: DC | PRN

## 2016-04-19 ENCOUNTER — Ambulatory Visit: Payer: BLUE CROSS/BLUE SHIELD | Admitting: Endocrinology

## 2016-04-24 ENCOUNTER — Encounter: Payer: BLUE CROSS/BLUE SHIELD | Admitting: Internal Medicine

## 2016-05-11 NOTE — Progress Notes (Signed)
[facility] [address] [place], [place] [address] Phone: [phone] Subjective:    I'm seeing this patient by the request  of:  Kathlene November, MD   CC: right shoulder pain.   [id number]  Daniel Lyons is a 61 y.o. male coming in with complaint of right shoulder Pain. This is been icing long time. Seems to be significant worse over the last 6 months. Patient states that certain movements causes a sharp pain. Seems to stay localized. Denies any radiation down the arm. Patient states though that he hasn't unable to do heavy lifting secondary to the pain. Sometimes can be painful enough that wakes him up at night. Rates the severity of pain a 7 out of 10. Does respond to anti-inflammatories.     Past Medical History:  Diagnosis Date  . Elevated LFTs   . Giant cell tumor    removal  . Heart murmur    frequent PVCs  . Hypertension   . Reactive airway disease    "long ago"   Past Surgical History:  Procedure Laterality Date  . finger tumor removal     L index, giant cell tumor   . VASECTOMY  1994   Social History   Social History  . Marital status: Married    Spouse name: N/A  . Number of children: 3  . Years of education: N/A   Occupational History  . nurse anesthesist  @ [employer]    Social History Main Topics  . Smoking status: Never Smoker  . Smokeless tobacco: Never Used  . Alcohol use 0.0 oz/week     Comment: 12 oz wine daily  . Drug use: No  . Sexual activity: Not on file   Other Topics Concern  . Not on file   Social History Narrative  . No narrative on file   No Known Allergies Family History  Problem Relation Age of Onset  . Prostate cancer Father 46  . Pancreatic cancer Father   . Prostate cancer Paternal Uncle   . CAD Neg Hx   . Diabetes Neg Hx   . Colon cancer Neg Hx     Past medical history, social, surgical and family history all reviewed in electronic medical record.  No pertanent information unless stated  regarding to the chief complaint.   Review of Systems:Review of systems updated and as accurate as of 05/11/16  No headache, visual changes, nausea, vomiting, diarrhea, constipation, dizziness, abdominal pain, skin rash, fevers, chills, night sweats, weight loss, swollen lymph nodes, body aches, joint swelling, muscle aches, chest pain, shortness of breath, mood changes.   Objective  There were no vitals taken for this visit. Systems examined below as of 05/11/16   General: No apparent distress alert and oriented x3 mood and affect normal, dressed appropriately.  HEENT: Pupils equal, extraocular movements intact  Respiratory: Patient's speak in full sentences and does not appear short of breath  Cardiovascular: No lower extremity edema, non tender, no erythema  Skin: Warm dry intact with no signs of infection or rash on extremities or on axial skeleton.  Abdomen: Soft nontender  Neuro: Cranial nerves II through XII are intact, neurovascularly intact in all extremities with 2+ DTRs and 2+ pulses.  Lymph: No lymphadenopathy of posterior or anterior cervical chain or axillae bilaterally.  Gait normal with good balance and coordination.  MSK:  Non tender with full range of motion and good stability and symmetric strength and tone of elbows, wrist, hip,  knee and ankles bilaterally.  Shoulder: Right Inspection reveals no abnormalities, atrophy or asymmetry. Palpation is normal with no tenderness over AC joint or bicipital groove. ROM is full in all planes passively. Rotator cuff strength normal throughout. signs of impingement with positive Neer and Hawkin's tests, but negative empty can sign. Positive speeds and positive O'Brien's Normal scapular function observed. No painful arc and no drop arm sign. No apprehension sign  MSK US performed of: Right This study was ordered, performed, and interpreted by Charlann Boxer D.O.  Shoulder:   Supraspinatus:  Appears normal on long and transverse  views, Bursal bulge seen with shoulder abduction on impingement view. Subscapularis:  Appears normal on long and transverse views. Positive bursa AC joint:  Capsule undistended, no geyser sign. Glenohumeral Joint:  Appears normal without effusion. Glenoid Labrum:  Intact without visualized tears. Biceps Tendon:  Bicep tendon noted. Seems to have calcific changes already. Patient does have some subluxation noted.  Impression: bicep tendinitis and parial subluxation      Impression and Recommendations:     This case required medical decision making of moderate complexity.      Note: This dictation was prepared with Dragon dictation along with smaller phrase technology. Any transcriptional errors that result from this process are unintentional.

## 2016-05-13 ENCOUNTER — Ambulatory Visit: Payer: Self-pay

## 2016-05-13 ENCOUNTER — Ambulatory Visit (INDEPENDENT_AMBULATORY_CARE_PROVIDER_SITE_OTHER): Payer: BLUE CROSS/BLUE SHIELD | Admitting: Family Medicine

## 2016-05-13 ENCOUNTER — Encounter: Payer: Self-pay | Admitting: Family Medicine

## 2016-05-13 VITALS — BP 140/96 | HR 88 | Ht 72.0 in | Wt 244.0 lb

## 2016-05-13 DIAGNOSIS — M25511 Pain in right shoulder: Secondary | ICD-10-CM | POA: Diagnosis not present

## 2016-05-13 DIAGNOSIS — M7521 Bicipital tendinitis, right shoulder: Secondary | ICD-10-CM | POA: Diagnosis not present

## 2016-05-13 NOTE — Assessment & Plan Note (Signed)
Bicep tendinitis noted with some subluxation. Given home exercises, we discussed compression, topical anti-inflammatories given. We discussed which activities doing which ones to avoid. No significant improvement follow-up again in 3-4 weeks.

## 2016-05-13 NOTE — Patient Instructions (Signed)
Good to see you.  Looks mor elike chronic bicep subluxation with a possible small labral tear.  Ice 20 minutes 2 times daily. Usually after activity and before bed. pennsaid pinkie amount topically 2 times daily as needed.  Exercises 3 times a week.  Keep hands within peripheral vision  Try to drop weight a little maybe 50% and increase 10% a week.  Give me an update in 3 weeks and tell me how you are doing.

## 2016-07-30 ENCOUNTER — Other Ambulatory Visit: Payer: Self-pay | Admitting: Internal Medicine

## 2016-07-30 NOTE — Telephone Encounter (Signed)
Pt is requesting refill on zaleplon (Sonata).  Last OV: 03/22/2016 Last Fill: 05/02/2015 #15 and 0RF  Please advise.

## 2016-07-31 MED ORDER — ZALEPLON 10 MG PO CAPS: 10.0000 mg | ORAL_CAPSULE | Freq: Every evening | ORAL | 0 refills | Status: DC | PRN

## 2016-07-31 NOTE — Telephone Encounter (Signed)
Okay 15, no refills

## 2016-07-31 NOTE — Telephone Encounter (Signed)
Rx faxed to [facility] pharmacy.  

## 2016-07-31 NOTE — Telephone Encounter (Signed)
Rx printed, awaiting MD signature.  

## 2016-09-05 ENCOUNTER — Other Ambulatory Visit: Payer: Self-pay | Admitting: Internal Medicine

## 2016-09-19 ENCOUNTER — Encounter: Payer: Self-pay | Admitting: Family Medicine

## 2016-09-23 ENCOUNTER — Encounter: Payer: Self-pay | Admitting: Family Medicine

## 2016-09-23 ENCOUNTER — Ambulatory Visit: Payer: Self-pay

## 2016-09-23 ENCOUNTER — Ambulatory Visit (INDEPENDENT_AMBULATORY_CARE_PROVIDER_SITE_OTHER): Payer: BLUE CROSS/BLUE SHIELD | Admitting: Family Medicine

## 2016-09-23 VITALS — BP 140/80 | HR 74 | Ht 72.0 in | Wt 238.0 lb

## 2016-09-23 DIAGNOSIS — M75102 Unspecified rotator cuff tear or rupture of left shoulder, not specified as traumatic: Secondary | ICD-10-CM

## 2016-09-23 DIAGNOSIS — IMO0002 Reserved for concepts with insufficient information to code with codable children: Secondary | ICD-10-CM

## 2016-09-23 DIAGNOSIS — M19012 Primary osteoarthritis, left shoulder: Secondary | ICD-10-CM | POA: Diagnosis not present

## 2016-09-23 DIAGNOSIS — M25512 Pain in left shoulder: Secondary | ICD-10-CM

## 2016-09-23 DIAGNOSIS — M19019 Primary osteoarthritis, unspecified shoulder: Secondary | ICD-10-CM | POA: Insufficient documentation

## 2016-09-23 NOTE — Patient Instructions (Signed)
Good to see you  Daniel Lyons is your friend.  Stay active.  pennsaid pinkie amount topically 2 times daily as needed.   No pack for 1 week See me when you need me

## 2016-09-23 NOTE — Progress Notes (Signed)
[facility] [place], [place] [address] Phone: [phone] Subjective:    I'm seeing this patient by the request  of:    CC: Left shoulder pain  WLN:[id number]  Daniel Lyons is a 61 y.o. male coming in with complaint of left shoulder pain. Describes the pain as a dull, throbbing aching pain. Patient states that certain things such as lifting has become more difficult. Patient does remember lifting a patient in this may cause some of the discomfort. Has played golf the last 3 days and afterward seems to be severe. Patient has taken over-the-counter medications with very minimal benefit. Patient states waking him up at night. No radiation down the arm.     Past Medical History:  Diagnosis Date  . Elevated LFTs   . Giant cell tumor    removal  . Heart murmur    frequent PVCs  . Hypertension   . Reactive airway disease    "long ago"   Past Surgical History:  Procedure Laterality Date  . finger tumor removal     L index, giant cell tumor   . VASECTOMY  1994   Social History   Social History  . Marital status: Married    Spouse name: N/A  . Number of children: 3  . Years of education: N/A   Occupational History  . 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 Smoking status: Never Smoker  . Smokeless tobacco: Never Used  . Alcohol use 0.0 oz/week     Comment: 12 oz wine daily  . Drug use: No  . Sexual activity: Not Asked   Other Topics Concern  . None   Social History Narrative  . None   No Known Allergies Family History  Problem Relation Age of Onset  . Prostate cancer Father 43  . Pancreatic cancer Father   . Prostate cancer Paternal Uncle   . CAD Neg Hx   . Diabetes Neg Hx   . Colon cancer Neg Hx     Past medical history, social, surgical and family history all reviewed in electronic medical record.  No pertanent information unless stated regarding to the chief complaint.   Review of  Systems:Review of systems updated and as accurate as of 09/23/16  No headache, visual changes, nausea, vomiting, diarrhea, constipation, dizziness, abdominal pain, skin rash, fevers, chills, night sweats, weight loss, swollen lymph nodes, body aches, joint swelling, muscle aches, chest pain, shortness of breath, mood changes.   Objective  Blood pressure 140/80, pulse 74, height 6' (1.829 m), weight 238 lb (108 kg), SpO2 98 %. Systems examined below as of 09/23/16   General: No apparent distress alert and oriented x3 mood and affect normal, dressed appropriately.  HEENT: Pupils equal, extraocular movements intact  Respiratory: Patient's speak in full sentences and does not appear short of breath  Cardiovascular: No lower extremity edema, non tender, no erythema  Skin: Warm dry intact with no signs of infection or rash on extremities or on axial skeleton.  Abdomen: Soft nontender  Neuro: Cranial nerves II through XII are intact, neurovascularly intact in all extremities with 2+ DTRs and 2+ pulses.  Lymph: No lymphadenopathy of posterior or anterior cervical chain or axillae bilaterally.  Gait normal with good balance and coordination.  MSK:  Non tender with full range of motion and good stability and symmetric strength and tone of  elbows, wrist, hip, knee and ankles bilaterally.  Shoulder:  left Inspection reveals no abnormalities, atrophy or asymmetry. Palpation is normal with no tenderness over AC joint or bicipital groove. ROM is full in all planes passively. Rotator cuff strength normal throughout. signs of impingement with positive Neer and Hawkin's tests, but negative empty can sign. Speeds and Yergason's tests normal. No labral pathology noted with negative Obrien's, negative clunk and good stability. Normal scapular function observed. No painful arc and no drop arm sign. No apprehension sign  MSK US performed of: left This study was ordered, performed, and interpreted by Charlann Boxer D.O.  Shoulder:   Supraspinatus:  Appears normal on long and transverse views, does have some hypoechoic changes surrounding the tendon but no true tear appreciated Bursal bulge seen with shoulder abduction on impingement view. Infraspinatus:  Appears normal on long and transverse views. Significant increase in Doppler flow Subscapularis:  Appears normal on long and transverse views. Positive bursa Teres Minor:  Appears normal on long and transverse views. AC joint:  Moderate to severe arthritis Glenohumeral Joint:  Appears normal without effusion. Glenoid Labrum:  Intact without visualized tears. Biceps Tendon:  Appears normal on long and transverse views, no fraying of tendon, tendon located in intertubercular groove, no subluxation with shoulder internal or external rotation.  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  Verbal informed consent obtained.  Time-out conducted.  Noted no overlying erythema, induration, or other signs of local infection.  Skin prepped in a sterile fashion.  Local anesthesia: Topical Ethyl chloride.  With sterile technique and under real time ultrasound guidance:  Joint visualized.  23g 1  inch needle inserted posterior approach. Pictures taken for needle placement. Patient did have injection of 2 cc of 1% lidocaine, 2 cc of 0.5% Marcaine, and 1.0 cc of Kenalog 40 mg/dL. Completed without difficulty  Pain immediately resolved suggesting accurate placement of the medication.  Advised to call if fevers/chills, erythema, induration, drainage, or persistent bleeding.  Images permanently stored and  available for review in the ultrasound unit.  Impression: Technically successful ultrasound guided injection.    Impression and Recommendations:     This case required medical decision making of moderate complexity.      Note: This dictation was prepared with Dragon dictation along with smaller phrase technology. Any transcriptional errors that result from this process are unintentional.

## 2016-09-23 NOTE — Progress Notes (Signed)
Pre visit review using our clinic review tool, if applicable. No additional management support is needed unless otherwise documented below in the visit note. 

## 2016-09-23 NOTE — Assessment & Plan Note (Signed)
I believe the patient has more of a contusion to the rotator cuff. 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 Patient was given an injection today. Tolerated the procedure well. We discussed icing regimen, home exercises, which activities to do a which ones to avoid. Patient is going to start increasing activity as tolerated. We discussed hand positioning with some lifting. Patient will return again in 4-6 weeks. Worsening symptoms consider acromial clavicular injection.

## 2016-10-21 ENCOUNTER — Other Ambulatory Visit: Payer: Self-pay | Admitting: Internal Medicine

## 2016-11-01 ENCOUNTER — Other Ambulatory Visit: Payer: Self-pay | Admitting: Internal Medicine

## 2016-11-21 DIAGNOSIS — Z23 Encounter for immunization: Secondary | ICD-10-CM | POA: Diagnosis not present

## 2016-11-27 ENCOUNTER — Encounter: Payer: Self-pay | Admitting: Internal Medicine

## 2016-11-27 MED ORDER — FOSINOPRIL SODIUM 20 MG PO TABS: 40.0000 mg | ORAL_TABLET | Freq: Every day | ORAL | 0 refills | Status: DC

## 2016-11-27 MED ORDER — CHLORTHALIDONE 25 MG PO TABS: 25.0000 mg | ORAL_TABLET | Freq: Every day | ORAL | 0 refills | Status: DC

## 2016-11-27 NOTE — Telephone Encounter (Signed)
30 day supply sent to [facility], no further w/o OV.

## 2016-11-28 MED ORDER — FOSINOPRIL SODIUM 20 MG PO TABS
40.0000 mg | ORAL_TABLET | Freq: Every day | ORAL | 5 refills | Status: DC
Start: 2016-11-28 — End: 2017-07-09

## 2016-11-28 MED ORDER — CHLORTHALIDONE 25 MG PO TABS: 25.0000 mg | ORAL_TABLET | Freq: Every day | ORAL | 5 refills | Status: DC

## 2016-11-28 NOTE — Telephone Encounter (Signed)
Send refills 6 months  Received: Today  Message Contents  Colon Branch, MD  Damita Dunnings, [place]

## 2016-11-28 NOTE — Addendum Note (Signed)
Addended byDamita Dunnings D on: 11/28/2016 01:06 PM   Modules accepted: Orders

## 2016-12-25 DIAGNOSIS — D225 Melanocytic nevi of trunk: Secondary | ICD-10-CM | POA: Diagnosis not present

## 2016-12-25 DIAGNOSIS — L570 Actinic keratosis: Secondary | ICD-10-CM | POA: Diagnosis not present

## 2016-12-25 DIAGNOSIS — L821 Other seborrheic keratosis: Secondary | ICD-10-CM | POA: Diagnosis not present

## 2016-12-25 DIAGNOSIS — L578 Other skin changes due to chronic exposure to nonionizing radiation: Secondary | ICD-10-CM | POA: Diagnosis not present

## 2016-12-25 DIAGNOSIS — C44722 Squamous cell carcinoma of skin of right lower limb, including hip: Secondary | ICD-10-CM | POA: Diagnosis not present

## 2016-12-25 DIAGNOSIS — D1801 Hemangioma of skin and subcutaneous tissue: Secondary | ICD-10-CM | POA: Diagnosis not present

## 2017-01-15 ENCOUNTER — Other Ambulatory Visit: Payer: Self-pay | Admitting: Internal Medicine

## 2017-04-22 ENCOUNTER — Encounter: Payer: BLUE CROSS/BLUE SHIELD | Admitting: Internal Medicine

## 2017-07-09 ENCOUNTER — Other Ambulatory Visit: Payer: Self-pay | Admitting: Internal Medicine

## 2017-07-09 NOTE — Telephone Encounter (Signed)
Sonata Rx faxed to [facility]. OV needed for further refills.

## 2017-08-17 ENCOUNTER — Other Ambulatory Visit: Payer: Self-pay | Admitting: Internal Medicine

## 2017-09-15 ENCOUNTER — Other Ambulatory Visit: Payer: Self-pay | Admitting: Internal Medicine

## 2017-09-23 ENCOUNTER — Encounter: Payer: Self-pay | Admitting: Internal Medicine

## 2017-09-24 ENCOUNTER — Ambulatory Visit (INDEPENDENT_AMBULATORY_CARE_PROVIDER_SITE_OTHER): Payer: BLUE CROSS/BLUE SHIELD | Admitting: Internal Medicine

## 2017-09-24 ENCOUNTER — Encounter: Payer: Self-pay | Admitting: Internal Medicine

## 2017-09-24 VITALS — BP 118/64 | HR 80 | Temp 98.1°F | Resp 16 | Ht 72.0 in | Wt 224.4 lb

## 2017-09-24 DIAGNOSIS — Z8042 Family history of malignant neoplasm of prostate: Secondary | ICD-10-CM

## 2017-09-24 DIAGNOSIS — G4700 Insomnia, unspecified: Secondary | ICD-10-CM | POA: Diagnosis not present

## 2017-09-24 DIAGNOSIS — I10 Essential (primary) hypertension: Secondary | ICD-10-CM

## 2017-09-24 LAB — CBC WITH DIFFERENTIAL/PLATELET
BASOS ABS: 0 10*3/uL (ref 0.0–0.1)
Basophils Relative: 0.7 % (ref 0.0–3.0)
EOS PCT: 3.8 % (ref 0.0–5.0)
Eosinophils Absolute: 0.1 10*3/uL (ref 0.0–0.7)
HEMATOCRIT: 42.8 % (ref 39.0–52.0)
Hemoglobin: 14.6 g/dL (ref 13.0–17.0)
LYMPHS PCT: 34.5 % (ref 12.0–46.0)
Lymphs Abs: 1.2 10*3/uL (ref 0.7–4.0)
MCHC: 34.1 g/dL (ref 30.0–36.0)
MCV: 89.6 fl (ref 78.0–100.0)
MONOS PCT: 12.4 % — AB (ref 3.0–12.0)
Monocytes Absolute: 0.4 10*3/uL (ref 0.1–1.0)
Neutro Abs: 1.7 10*3/uL (ref 1.4–7.7)
Neutrophils Relative %: 48.6 % (ref 43.0–77.0)
Platelets: 300 10*3/uL (ref 150.0–400.0)
RBC: 4.77 Mil/uL (ref 4.22–5.81)
RDW: 13.6 % (ref 11.5–15.5)
WBC: 3.4 10*3/uL — ABNORMAL LOW (ref 4.0–10.5)

## 2017-09-24 LAB — COMPREHENSIVE METABOLIC PANEL
ALK PHOS: 38 U/L — AB (ref 39–117)
ALT: 31 U/L (ref 0–53)
AST: 25 U/L (ref 0–37)
Albumin: 4.6 g/dL (ref 3.5–5.2)
BILIRUBIN TOTAL: 0.4 mg/dL (ref 0.2–1.2)
BUN: 11 mg/dL (ref 6–23)
CALCIUM: 9.4 mg/dL (ref 8.4–10.5)
CO2: 31 meq/L (ref 19–32)
Chloride: 93 mEq/L — ABNORMAL LOW (ref 96–112)
Creatinine, Ser: 0.92 mg/dL (ref 0.40–1.50)
GFR: 89.07 mL/min (ref >60.00)
Glucose, Bld: 91 mg/dL (ref 70–99)
POTASSIUM: 4.6 meq/L (ref 3.5–5.1)
Sodium: 133 mEq/L — ABNORMAL LOW (ref 135–145)
TOTAL PROTEIN: 6.9 g/dL (ref 6.0–8.3)

## 2017-09-24 LAB — LIPID PANEL
CHOL/HDL RATIO: 2
Cholesterol: 166 mg/dL (ref 0–200)
HDL: 69.3 mg/dL (ref >39.00)
LDL CALC: 86 mg/dL (ref 0–99)
NONHDL: 96.91
Triglycerides: 54 mg/dL (ref 0.0–149.0)
VLDL: 10.8 mg/dL (ref 0.0–40.0)

## 2017-09-24 LAB — PSA: PSA: 0.58 ng/mL (ref 0.10–4.00)

## 2017-09-24 MED ORDER — ZALEPLON 10 MG PO CAPS: 10.0000 mg | ORAL_CAPSULE | Freq: Every evening | ORAL | 1 refills | Status: DC | PRN

## 2017-09-24 MED ORDER — FOSINOPRIL SODIUM 20 MG PO TABS: 40.0000 mg | ORAL_TABLET | Freq: Every day | ORAL | 3 refills | Status: DC

## 2017-09-24 MED ORDER — CHLORTHALIDONE 25 MG PO TABS: 25.0000 mg | ORAL_TABLET | Freq: Every day | ORAL | 3 refills | Status: DC

## 2017-09-24 NOTE — Patient Instructions (Signed)
GO TO THE LAB : Get the blood work     GO TO THE FRONT DESK Schedule your next appointment for a physical exam in 1 year   Check the  blood pressure once a month Be sure your blood pressure is between 110/65 and  135/85. If it is consistently higher or lower, let me know

## 2017-09-24 NOTE — Progress Notes (Signed)
Pre visit review using our clinic review tool, if applicable. No additional management support is needed unless otherwise documented below in the visit note. 

## 2017-09-24 NOTE — Assessment & Plan Note (Signed)
HTN: Refill hygroton and Monopril, checking BMP, CBC, FLP ED: Since he improve diet and exercise is almost not an issue, hardly ever has use sildenafil Fatigue: See last visit, since he improve his life style, this is not an issue. Insomnia: Taking melatonin occasionally and Sonata rarely.  Refill sent just in case. FH prostate cancer: Asymptomatic, DRE today normal, check a PSA RTC 1 year CPX

## 2017-09-24 NOTE — Progress Notes (Signed)
Subjective:    Patient ID: Daniel Lyons, male    DOB: [birth year], 62 y.o.   MRN: [id number]  DOS:  09/24/2017 Type of visit - description : Routine visit Interval history: HTN: Good med compliance, no apparent side effects ED: Rarely takes Viagra. Insomnia: Rarely takes Sonata, needs refills.  Wt Readings from Last 3 Encounters:  09/24/17 224 lb 6 oz (101.8 kg)  09/23/16 238 lb (108 kg)  05/13/16 244 lb (110.7 kg)     Review of Systems Denies dysuria, gross hematuria difficulty urinating. amb  BPs 118, never more than 140.  Diastolic BP is in the 29B Doing great with diet, very low carbohydrate, he is much more active doing a stationary bike Restaurant manager, fast food).  Has lost weight. Denies any fatigue. No anxiety-depression   Past Medical History:  Diagnosis Date  . Elevated LFTs   . Giant cell tumor    removal  . Heart murmur    frequent PVCs  . Hypertension   . Reactive airway disease    "long ago"    Past Surgical History:  Procedure Laterality Date  . finger tumor removal     L index, giant cell tumor   . VASECTOMY  1994    Social History   Socioeconomic History  . Marital status: Married    Spouse name: Not on file  . Number of children: 3  . Years of education: Not on file  . Highest education level: Not on file  Occupational History  . Occupation: Ecologist  @ [employer]  . Financial resource strain: Not on file  . Food insecurity:    Worry: Not on file    Inability: Not on file  . Transportation needs:    Medical: Not on file    Non-medical: Not on file  Tobacco Use  . Smoking status: Never Smoker  . Smokeless tobacco: Never Used  Substance and Sexual Activity  . Alcohol use: Yes    Alcohol/week: 0.0 oz    Comment: 12 oz wine daily  . Drug use: No  . Sexual activity: Not on file  Lifestyle  . Physical activity:    Days per week: Not on file    Minutes per session: Not on file  . Stress: Not on file  Relationships  .  Social connections:    Talks on phone: Not on file    Gets together: Not on file    Attends religious service: Not on file    Active member of club or organization: Not on file    Attends meetings of clubs or organizations: Not on file    Relationship status: Not on file  . Intimate partner violence:    Fear of current or ex partner: Not on file    Emotionally abused: Not on file    Physically abused: Not on file    Forced sexual activity: Not on file  Other Topics Concern  . Not on file  Social History Narrative  . Not on file      Allergies as of 09/24/2017   No Known Allergies     Medication List        Accurate as of 09/24/17  5:51 PM. Always use your most recent med list.          albuterol 108 (90 Base) MCG/ACT inhaler Commonly known as:  VENTOLIN HFA Inhale 2 puffs into the lungs every 6 (six) hours as needed for wheezing or shortness of breath.  chlorthalidone 25 MG tablet Commonly known as:  HYGROTON Take 1 tablet (25 mg total) by mouth daily.   fosinopril 20 MG tablet Commonly known as:  MONOPRIL Take 2 tablets (40 mg total) by mouth daily.   VITAMIN D3 PO Take 1 capsule by mouth daily.   zaleplon 10 MG capsule Commonly known as:  SONATA Take 1 capsule (10 mg total) by mouth at bedtime as needed for sleep.          Objective:   Physical Exam BP 118/64 (BP Location: Left Arm, Patient Position: Sitting, Cuff Size: Small)   Pulse 80   Temp 98.1 F (36.7 C) (Oral)   Resp 16   Ht 6' (1.829 m)   Wt 224 lb 6 oz (101.8 kg)   SpO2 96%   BMI 30.43 kg/m  General:   Well developed, NAD, see BMI.  HEENT:  Normocephalic . Face symmetric, atraumatic Lungs:  CTA B Normal respiratory effort, no intercostal retractions, no accessory muscle use. Heart: RRR,  no murmur.  no pretibial edema bilaterally  Abdomen:  Not distended, soft, non-tender. No rebound or rigidity.  Rectal: External abnormalities: none. Normal sphincter tone. No rectal masses or  tenderness.  Brown stools Prostate: Prostate gland firm and smooth, no enlargement, nodularity, tenderness, mass, asymmetry or induration Skin: Not pale. Not jaundice Neurologic:  alert & oriented X3.  Speech normal, gait appropriate for age and unassisted Psych--  Cognition and judgment appear intact.  Cooperative with normal attention span and concentration.  Behavior appropriate. No anxious or depressed appearing.     Assessment & Plan:   Assessment HTN -on fosinopril for a while, HCTZ change to chlorthalidone 03-2015. H/o RAD   H/o freq PVCs, dx remotely, holter in the 90s   PLAN HTN: Refill hygroton and Monopril, checking BMP, CBC, FLP ED: Since he improve diet and exercise is almost not an issue, hardly ever has use sildenafil Fatigue: See last visit, since he improve his life style, this is not an issue. Insomnia: Taking melatonin occasionally and Sonata rarely.  Refill sent just in case. FH prostate cancer: Asymptomatic, DRE today normal, check a PSA RTC 1 year CPX

## 2017-11-17 DIAGNOSIS — Z23 Encounter for immunization: Secondary | ICD-10-CM | POA: Diagnosis not present

## 2017-11-19 ENCOUNTER — Encounter: Payer: Self-pay | Admitting: Internal Medicine

## 2017-12-02 DIAGNOSIS — D23122 Other benign neoplasm of skin of left lower eyelid, including canthus: Secondary | ICD-10-CM | POA: Diagnosis not present

## 2017-12-02 DIAGNOSIS — D485 Neoplasm of uncertain behavior of skin: Secondary | ICD-10-CM | POA: Diagnosis not present

## 2017-12-17 DIAGNOSIS — H524 Presbyopia: Secondary | ICD-10-CM | POA: Diagnosis not present

## 2017-12-17 DIAGNOSIS — H52222 Regular astigmatism, left eye: Secondary | ICD-10-CM | POA: Diagnosis not present

## 2018-03-17 DIAGNOSIS — L821 Other seborrheic keratosis: Secondary | ICD-10-CM | POA: Diagnosis not present

## 2018-03-17 DIAGNOSIS — D23122 Other benign neoplasm of skin of left lower eyelid, including canthus: Secondary | ICD-10-CM | POA: Diagnosis not present

## 2018-05-04 ENCOUNTER — Encounter: Payer: Self-pay | Admitting: Internal Medicine

## 2018-05-04 ENCOUNTER — Other Ambulatory Visit: Payer: Self-pay | Admitting: Internal Medicine

## 2018-05-04 MED ORDER — ALBUTEROL SULFATE HFA 108 (90 BASE) MCG/ACT IN AERS: 2.0000 | INHALATION_SPRAY | Freq: Four times a day (QID) | RESPIRATORY_TRACT | 5 refills | Status: DC | PRN

## 2018-05-05 ENCOUNTER — Other Ambulatory Visit: Payer: Self-pay | Admitting: Internal Medicine

## 2018-05-05 MED ORDER — PREDNISONE 10 MG PO TABS: ORAL_TABLET | ORAL | 0 refills | Status: DC

## 2018-05-14 ENCOUNTER — Encounter: Payer: Self-pay | Admitting: Internal Medicine

## 2018-08-11 LAB — BASIC METABOLIC PANEL
BUN: 16 (ref 4–21)
Creatinine: 1 (ref 0.6–1.3)
Glucose: 94
Potassium: 3.9 (ref 3.4–5.3)
Sodium: 129 — AB (ref 137–147)

## 2018-08-11 LAB — PSA: PSA: 0.4

## 2018-08-17 ENCOUNTER — Encounter: Payer: Self-pay | Admitting: Internal Medicine

## 2018-09-30 ENCOUNTER — Encounter: Payer: BLUE CROSS/BLUE SHIELD | Admitting: Internal Medicine

## 2018-10-07 LAB — BASIC METABOLIC PANEL
BUN: 16 (ref 4–21)
Creatinine: 0.8 (ref 0.6–1.3)
Glucose: 96
Potassium: 4.3 (ref 3.4–5.3)
Sodium: 132 — AB (ref 137–147)

## 2018-10-07 LAB — HEPATIC FUNCTION PANEL
ALT: 25 (ref 10–40)
AST: 23 (ref 14–40)
Alkaline Phosphatase: 38 (ref 25–125)
Bilirubin, Total: 0.5

## 2018-10-21 ENCOUNTER — Encounter: Payer: BLUE CROSS/BLUE SHIELD | Admitting: Internal Medicine

## 2018-10-21 ENCOUNTER — Telehealth: Payer: Self-pay | Admitting: Internal Medicine

## 2018-10-21 NOTE — Telephone Encounter (Signed)
See below, please advised patient not to worry, reschedule his CPE    Margrett Rud to Me      10/21/18 1:11 PM Daniel Lyons was on call for work today and had to cancel his CPE due to being called into work. He wanted me to send you a note of Apology explaing why he had to cancel

## 2018-10-22 ENCOUNTER — Encounter: Payer: Self-pay | Admitting: Internal Medicine

## 2018-11-20 ENCOUNTER — Other Ambulatory Visit: Payer: Self-pay

## 2018-11-23 ENCOUNTER — Other Ambulatory Visit: Payer: Self-pay

## 2018-11-23 ENCOUNTER — Encounter: Payer: Self-pay | Admitting: Internal Medicine

## 2018-11-23 ENCOUNTER — Ambulatory Visit (INDEPENDENT_AMBULATORY_CARE_PROVIDER_SITE_OTHER): Payer: BC Managed Care – PPO | Admitting: Internal Medicine

## 2018-11-23 VITALS — BP 151/88 | HR 57 | Temp 97.2°F | Resp 16 | Ht 72.0 in | Wt 224.4 lb

## 2018-11-23 DIAGNOSIS — Z0000 Encounter for general adult medical examination without abnormal findings: Secondary | ICD-10-CM

## 2018-11-23 DIAGNOSIS — Z23 Encounter for immunization: Secondary | ICD-10-CM

## 2018-11-23 NOTE — Progress Notes (Signed)
Pre visit review using our clinic review tool, if applicable. No additional management support is needed unless otherwise documented below in the visit note. 

## 2018-11-23 NOTE — Progress Notes (Signed)
Subjective:    Patient ID: Daniel Lyons, male    DOB: 1955-10-24, 63 y.o.   MRN: [id number]  DOS:  11/23/2018 Type of visit - description: CPX Patient reports he is doing great, has changed his diet, lost some weight. Ambulatory BP is normal. Sleeps well.    Review of Systems A 14 point review of systems is negative    Past Medical History:  Diagnosis Date  . Elevated LFTs   . Giant cell tumor    removal  . Heart murmur    frequent PVCs  . Hypertension   . Reactive airway disease    "long ago"    Past Surgical History:  Procedure Laterality Date  . finger tumor removal     L index, giant cell tumor   . VASECTOMY  1994    Social History   Socioeconomic History  . Marital status: Married    Spouse name: Not on file  . Number of children: 3  . Years of education: Not on file  . Highest education level: Not on file  Occupational History  . Occupation: Ecologist  @ [employer]  . Financial resource strain: Not on file  . Food insecurity    Worry: Not on file    Inability: Not on file  . Transportation needs    Medical: Not on file    Non-medical: Not on file  Tobacco Use  . Smoking status: Never Smoker  . Smokeless tobacco: Never Used  Substance and Sexual Activity  . Alcohol use: Yes    Alcohol/week: 0.0 standard drinks    Comment: 12 oz wine daily  . Drug use: No  . Sexual activity: Not on file  Lifestyle  . Physical activity    Days per week: Not on file    Minutes per session: Not on file  . Stress: Not on file  Relationships  . Social Herbalist on phone: Not on file    Gets together: Not on file    Attends religious service: Not on file    Active member of club or organization: Not on file    Attends meetings of clubs or organizations: Not on file    Relationship status: Not on file  . Intimate partner violence    Fear of current or ex partner: Not on file    Emotionally abused: Not on file    Physically  abused: Not on file    Forced sexual activity: Not on file  Other Topics Concern  . Not on file  Social History Narrative  . Not on file     Family History  Problem Relation Age of Onset  . Prostate cancer Father 48  . Pancreatic cancer Father   . Prostate cancer Paternal Uncle   . CAD Neg Hx   . Diabetes Neg Hx   . Colon cancer Neg Hx      Allergies as of 11/23/2018   No Known Allergies     Medication List       Accurate as of November 23, 2018 11:59 PM. If you have any questions, ask your nurse or doctor.        STOP taking these medications   predniSONE 10 MG tablet Commonly known as: DELTASONE Stopped by: Kathlene November, MD     TAKE these medications   albuterol 108 (90 Base) MCG/ACT inhaler Commonly known as: Ventolin HFA Inhale 2 puffs into the lungs every 6 (six) hours  as needed for wheezing or shortness of breath.   chlorthalidone 25 MG tablet Commonly known as: HYGROTON Take 1 tablet (25 mg total) by mouth daily.   fosinopril 20 MG tablet Commonly known as: MONOPRIL Take 2 tablets (40 mg total) by mouth daily.   VITAMIN D3 PO Take 1 capsule by mouth daily.   zaleplon 10 MG capsule Commonly known as: SONATA Take 1 capsule (10 mg total) by mouth at bedtime as needed for sleep.           Objective:   Physical Exam BP (!) 151/88 (BP Location: Left Arm, Patient Position: Sitting, Cuff Size: Normal)   Pulse (!) 57   Temp (!) 97.2 F (36.2 C) (Temporal)   Resp 16   Ht 6' (1.829 m)   Wt 224 lb 6 oz (101.8 kg)   SpO2 100%   BMI 30.43 kg/m   General: Well developed, NAD, BMI noted Neck: No  thyromegaly  HEENT:  Normocephalic . Face symmetric, atraumatic Lungs:  CTA B Normal respiratory effort, no intercostal retractions, no accessory muscle use. Heart: RRR,  no murmur.  No pretibial edema bilaterally  Abdomen:  Not distended, soft, non-tender. No rebound or rigidity.   Skin: Exposed areas without rash. Not pale. Not jaundice Neurologic:   alert & oriented X3.  Speech normal, gait appropriate for age and unassisted Strength symmetric and appropriate for age.  Psych: Cognition and judgment appear intact.  Cooperative with normal attention span and concentration.  Behavior appropriate. No anxious or depressed appearing.      Assessment     Assessment HTN -on fosinopril for a while, HCTZ change to chlorthalidone 03-2015. H/o RAD   H/o freq PVCs, dx remotely, holter in the 90s   PLAN Here for CPX HTN: BP today slightly elevated.   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  Doing well RTC 1 year

## 2018-11-23 NOTE — Patient Instructions (Addendum)
   GO TO THE FRONT DESK Schedule your next appointment   for physical exam in 1 year  Please think about getting shingles shot called Shingrix  Continue checking your blood pressure, BP GOAL is between 110/65 and  135/85. If it is consistently higher or lower, let me know  Please send me the copy of the recent blood work

## 2018-11-23 NOTE — Assessment & Plan Note (Addendum)
-  Td 07/2014 - flu shot today - CCS: Colonoscopy 2012 reportedly normal -FH Prostate ca: DRE 09/2017 wnl, PSA done elsewhere recently, daily patient will send records. -Labs done recently elsewhere, will bring copies  -Doing great with lifestyle, doing intermittent fasting, has lost some weight.

## 2018-11-24 NOTE — Assessment & Plan Note (Signed)
Here for CPX HTN: BP today slightly elevated.   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  Doing well RTC 1 year

## 2018-11-29 ENCOUNTER — Other Ambulatory Visit: Payer: Self-pay | Admitting: Internal Medicine

## 2018-12-09 ENCOUNTER — Telehealth: Payer: Self-pay | Admitting: Internal Medicine

## 2018-12-09 DIAGNOSIS — I10 Essential (primary) hypertension: Secondary | ICD-10-CM

## 2018-12-09 NOTE — Telephone Encounter (Signed)
Labs 08/11/2018: Sodium 129, creatinine 0.9.  PSA 0.4. Labs 10/08/2018 Glucose 96, sodium 132, LFTs normal. Please advise patient via message: Recommend to get a FLP, CBC at his convenience.  Please enter orders.

## 2018-12-09 NOTE — Telephone Encounter (Signed)
Mychart message sent. FLP and CBC ordered.

## 2018-12-16 DIAGNOSIS — L821 Other seborrheic keratosis: Secondary | ICD-10-CM | POA: Diagnosis not present

## 2018-12-16 DIAGNOSIS — D1801 Hemangioma of skin and subcutaneous tissue: Secondary | ICD-10-CM | POA: Diagnosis not present

## 2019-04-19 ENCOUNTER — Ambulatory Visit (INDEPENDENT_AMBULATORY_CARE_PROVIDER_SITE_OTHER): Payer: BC Managed Care – PPO | Admitting: Family Medicine

## 2019-04-19 ENCOUNTER — Other Ambulatory Visit: Payer: Self-pay

## 2019-04-19 ENCOUNTER — Ambulatory Visit (INDEPENDENT_AMBULATORY_CARE_PROVIDER_SITE_OTHER): Payer: BC Managed Care – PPO

## 2019-04-19 ENCOUNTER — Encounter: Payer: Self-pay | Admitting: Family Medicine

## 2019-04-19 VITALS — BP 150/90 | HR 79 | Ht 72.0 in | Wt 234.0 lb

## 2019-04-19 DIAGNOSIS — M79645 Pain in left finger(s): Secondary | ICD-10-CM | POA: Diagnosis not present

## 2019-04-19 DIAGNOSIS — M180 Bilateral primary osteoarthritis of first carpometacarpal joints: Secondary | ICD-10-CM

## 2019-04-19 DIAGNOSIS — M79644 Pain in right finger(s): Secondary | ICD-10-CM | POA: Diagnosis not present

## 2019-04-19 NOTE — Progress Notes (Signed)
[facility] [address] [place] Phone: [phone] Subjective:   I Daniel Lyons am serving as a Education administrator for Dr. Hulan Saas.  This visit occurred during the SARS-CoV-2 public health emergency.  Safety protocols were in place, including screening questions prior to the visit, additional usage of staff PPE, and extensive cleaning of exam room while observing appropriate contact time as indicated for disinfecting solutions.   I'm seeing this patient by the request  of:  Colon Branch, MD  CC: bilateral thumb pain   [id number]   09/23/2016 I believe the patient has more of a contusion to the rotator cuff. 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 Patient was given an injection today. Tolerated the procedure well. We discussed icing regimen, home exercises, which activities to do a which ones to avoid. Patient is going to start increasing activity as tolerated. We discussed hand positioning with some lifting. Patient will return again in 4-6 weeks. Worsening symptoms consider acromial clavicular injection.  Update 04/19/2019 Daniel Lyons is a 65 y.o. male coming in with complaint of left shoulder pain. Shoulder is doing well. Right thumb pain. No pain today. States he wakes up with limited ROM. Extension is painful. Left thumb has mild pain. Only uses dumbbells.     Past Medical History:  Diagnosis Date  . Elevated LFTs   . Giant cell tumor    removal  . Heart murmur    frequent PVCs  . Hypertension   . Reactive airway disease    "long ago"   Past Surgical History:  Procedure Laterality Date  . finger tumor removal     L index, giant cell tumor   . VASECTOMY  1994   Social History   Socioeconomic History  . Marital status: Married    Spouse name: Not on file  . Number of children: 3  . Years of education: Not on file  . Highest education level: Not on file  Occupational History  . Occupation:  Ecologist  @ [employer]  Tobacco Use  . Smoking status: Never Smoker  . Smokeless tobacco: Never Used  Substance and Sexual Activity  . Alcohol use: Yes    Alcohol/week: 0.0 standard drinks    Comment: 12 oz wine daily  . Drug use: No  . Sexual activity: Not on file  Other Topics Concern  . Not on file  Social History Narrative  . Not on file   Social Determinants of Health   Financial Resource Strain:   . Difficulty of Paying Living Expenses: Not on file  Food Insecurity:   . Worried About Charity fundraiser in the Last Year: Not on file  . Ran Out of Food in the Last Year: Not on file  Transportation Needs:   . Lack of Transportation (Medical): Not on file  . Lack of Transportation (Non-Medical): Not on file  Physical Activity:   . Days of Exercise per Week: Not on file  . Minutes of Exercise per Session: Not on file  Stress:   . Feeling of Stress : Not on file  Social Connections:   . Frequency of Communication with Friends and Family: Not on file  . Frequency of Social Gatherings with Friends and Family: Not on file  . Attends Religious Services: Not on file  . Active Member of Clubs or Organizations: Not on file  . Attends Archivist Meetings: Not on file  . Marital  Status: Not on file   No Known Allergies Family History  Problem Relation Age of Onset  . Prostate cancer Father 67  . Pancreatic cancer Father   . Prostate cancer Paternal Uncle   . CAD Neg Hx   . Diabetes Neg Hx   . Colon cancer Neg Hx      Current Outpatient Medications (Cardiovascular):  .  chlorthalidone (HYGROTON) 25 MG tablet, Take 1 tablet (25 mg total) by mouth daily. .  fosinopril (MONOPRIL) 20 MG tablet, Take 2 tablets (40 mg total) by mouth daily.  Current Outpatient Medications (Respiratory):  .  albuterol (VENTOLIN HFA) 108 (90 Base) MCG/ACT inhaler, Inhale 2 puffs into the lungs every 6 (six) hours as needed for wheezing or shortness of breath.    Current  Outpatient Medications (Other):  Marland Kitchen  Cholecalciferol (VITAMIN D3 PO), Take 1 capsule by mouth daily. .  zaleplon (SONATA) 10 MG capsule, Take 1 capsule (10 mg total) by mouth at bedtime as needed for sleep.   Reviewed prior external information including notes and imaging from  primary care provider As well as notes that were available from care everywhere and other healthcare systems.  Past medical history, social, surgical and family history all reviewed in electronic medical record.  No pertanent information unless stated regarding to the chief complaint.   Review of Systems:  No headache, visual changes, nausea, vomiting, diarrhea, constipation, dizziness, abdominal pain, skin rash, fevers, chills, night sweats, weight loss, swollen lymph nodes, body aches, joint swelling, chest pain, shortness of breath, mood changes. POSITIVE muscle aches  Objective  Blood pressure (!) 150/90, pulse 79, height 6' (1.829 m), weight 234 lb (106.1 kg), SpO2 96 %.   General: No apparent distress alert and oriented x3 mood and affect normal, dressed appropriately.  HEENT: Pupils equal, extraocular movements intact  Respiratory: Patient's speak in full sentences and does not appear short of breath  Cardiovascular: No lower extremity edema, non tender, no erythema  Skin: Warm dry intact with no signs of infection or rash on extremities or on axial skeleton.  Abdomen: Soft nontender  Neuro: Cranial nerves II through XII are intact, neurovascularly intact in all extremities with 2+ DTRs and 2+ pulses.  Lymph: No lymphadenopathy of posterior or anterior cervical chain or axillae bilaterally.  MSK:   Hand pain bilaterally shows some very minimal arthritic changes.  Patient does have significant crepitus noted over the [facility] joint on the right side with a positive grind test.  Mild pain on the left side.  Patient though has good grip strength.  No wasting of the thenar eminence.  Neurovascularly intact distally.   Negative Finkelstein  Limited musculoskeletal ultrasound was performed and interpreted by Lyndal Pulley  Limited ultrasound of patient's right thumb has some hypoechoic changes within the joints patient does have narrowing.  Likely large bone spur noted in the area as well and with dynamic testing appears to cause some of the crepitus.  Increasing in Doppler flow in the area but no true masses appreciated.   Impression and Recommendations:     This case required medical decision making of moderate complexity. The above documentation has been reviewed and is accurate and complete Lyndal Pulley, DO       Note: This dictation was prepared with Dragon dictation along with smaller phrase technology. Any transcriptional errors that result from this process are unintentional.

## 2019-04-19 NOTE — Assessment & Plan Note (Signed)
Increasing discomfort and pain again.  This is a new problem for him.  We discussed bracing at night, topical anti-inflammatories, icing regimen.  Patient could do some bracing if necessary or not as well as we can consider injections if necessary.  Patient will make significant changes and follow-up with me again more on an as-needed basis

## 2019-04-19 NOTE — Patient Instructions (Addendum)
Good to see you Glove liners at night Pennsaid When it gets bad put brace on  Heat before ice after activity

## 2019-06-28 ENCOUNTER — Encounter: Payer: Self-pay | Admitting: Internal Medicine

## 2019-06-29 MED ORDER — AMLODIPINE BESYLATE 2.5 MG PO TABS: 2.5000 mg | ORAL_TABLET | Freq: Every day | ORAL | 6 refills | Status: DC

## 2019-07-14 ENCOUNTER — Encounter: Payer: Self-pay | Admitting: Internal Medicine

## 2019-09-14 ENCOUNTER — Other Ambulatory Visit: Payer: Self-pay | Admitting: Internal Medicine

## 2019-11-22 ENCOUNTER — Other Ambulatory Visit: Payer: Self-pay | Admitting: Internal Medicine

## 2019-12-22 ENCOUNTER — Other Ambulatory Visit: Payer: Self-pay | Admitting: Internal Medicine

## 2020-01-25 ENCOUNTER — Other Ambulatory Visit: Payer: Self-pay | Admitting: Internal Medicine

## 2020-02-12 ENCOUNTER — Encounter: Payer: Self-pay | Admitting: Internal Medicine

## 2020-02-14 ENCOUNTER — Other Ambulatory Visit: Payer: Self-pay | Admitting: Internal Medicine

## 2020-02-14 MED ORDER — CHLORTHALIDONE 25 MG PO TABS: 25.0000 mg | ORAL_TABLET | Freq: Every day | ORAL | 0 refills | Status: DC

## 2020-02-14 MED ORDER — FOSINOPRIL SODIUM 20 MG PO TABS: 40.0000 mg | ORAL_TABLET | Freq: Every day | ORAL | 0 refills | Status: DC

## 2020-02-14 MED ORDER — AMLODIPINE BESYLATE 2.5 MG PO TABS: 2.5000 mg | ORAL_TABLET | Freq: Every day | ORAL | 0 refills | Status: DC

## 2020-03-09 ENCOUNTER — Ambulatory Visit: Payer: BC Managed Care – PPO | Admitting: Internal Medicine

## 2020-03-15 ENCOUNTER — Other Ambulatory Visit: Payer: Self-pay | Admitting: Internal Medicine

## 2020-03-17 ENCOUNTER — Other Ambulatory Visit: Payer: Self-pay | Admitting: Internal Medicine

## 2020-03-22 ENCOUNTER — Ambulatory Visit: Payer: BC Managed Care – PPO | Admitting: Internal Medicine

## 2020-03-28 ENCOUNTER — Encounter: Payer: Self-pay | Admitting: Internal Medicine

## 2020-03-28 ENCOUNTER — Other Ambulatory Visit: Payer: Self-pay

## 2020-03-28 ENCOUNTER — Ambulatory Visit (INDEPENDENT_AMBULATORY_CARE_PROVIDER_SITE_OTHER): Payer: BC Managed Care – PPO | Admitting: Internal Medicine

## 2020-03-28 VITALS — BP 160/95 | HR 91 | Temp 98.1°F | Ht 72.0 in | Wt 236.0 lb

## 2020-03-28 DIAGNOSIS — R011 Cardiac murmur, unspecified: Secondary | ICD-10-CM | POA: Diagnosis not present

## 2020-03-28 DIAGNOSIS — Z0000 Encounter for general adult medical examination without abnormal findings: Secondary | ICD-10-CM | POA: Diagnosis not present

## 2020-03-28 DIAGNOSIS — I10 Essential (primary) hypertension: Secondary | ICD-10-CM | POA: Diagnosis not present

## 2020-03-28 MED ORDER — AMLODIPINE BESYLATE 5 MG PO TABS
5.0000 mg | ORAL_TABLET | Freq: Every day | ORAL | 1 refills | Status: DC
Start: 2020-03-28 — End: 2020-07-31

## 2020-03-28 MED ORDER — FOSINOPRIL SODIUM 20 MG PO TABS: 40.0000 mg | ORAL_TABLET | Freq: Every day | ORAL | 1 refills | Status: DC

## 2020-03-28 MED ORDER — CHLORTHALIDONE 25 MG PO TABS: 25.0000 mg | ORAL_TABLET | Freq: Every day | ORAL | 1 refills | Status: DC

## 2020-03-28 NOTE — Patient Instructions (Addendum)
Increase amlodipine to 5 mg. Other medications the same Please watch her salt intake  Check the  blood pressure every week BP GOAL is between 110/65 and  135/85. If it is consistently higher or lower, let me know     [place], [place] back for a checkup in 3 months  Get your blood work at the [facility]

## 2020-03-28 NOTE — Progress Notes (Signed)
Subjective:    Patient ID: Daniel Lyons, male    DOB: [birth year], 65 y.o.   MRN: [id number]  DOS:  03/28/2020 Type of visit - description: CPX Since the last office visit he is doing well. He is somewhat concerned about his blood pressure, at home is sometimes is elevated.  BP Readings from Last 3 Encounters:  03/28/20 (!) 160/95  04/19/19 (!) 150/90  11/23/18 (!) 151/88     Review of Systems  Other than above, a 14 point review of systems is negative     Past Medical History:  Diagnosis Date  . Elevated LFTs   . Giant cell tumor    removal  . Heart murmur    frequent PVCs  . Hypertension   . Reactive airway disease    "long ago"    Past Surgical History:  Procedure Laterality Date  . finger tumor removal     L index, giant cell tumor   . VASECTOMY  1994    Allergies as of 03/28/2020   No Known Allergies     Medication List       Accurate as of March 28, 2020 11:59 PM. If you have any questions, ask your nurse or doctor.        albuterol 108 (90 Base) MCG/ACT inhaler Commonly known as: Ventolin HFA Inhale 2 puffs into the lungs every 6 (six) hours as needed for wheezing or shortness of breath.   amLODipine 5 MG tablet Commonly known as: NORVASC Take 1 tablet (5 mg total) by mouth daily. What changed: Another medication with the same name was removed. Continue taking this medication, and follow the directions you see here. Changed by: Kathlene November, MD   chlorthalidone 25 MG tablet Commonly known as: HYGROTON Take 1 tablet (25 mg total) by mouth daily.   fosinopril 20 MG tablet Commonly known as: MONOPRIL Take 2 tablets (40 mg total) by mouth daily.   VITAMIN D3 PO Take 1 capsule by mouth daily.   zaleplon 10 MG capsule Commonly known as: SONATA Take 1 capsule (10 mg total) by mouth at bedtime as needed for sleep.          Objective:   Physical Exam BP (!) 160/95 (BP Location: Left Arm, Patient Position: Sitting, Cuff Size: Large)    Pulse 91   Temp 98.1 F (36.7 C) (Oral)   Ht 6' (1.829 m)   Wt 236 lb (107 kg)   SpO2 99%   BMI 32.01 kg/m  General: Well developed, NAD, BMI noted Neck: No  thyromegaly  HEENT:  Normocephalic . Face symmetric, atraumatic Lungs:  CTA B Normal respiratory effort, no intercostal retractions, no accessory muscle use. Heart: RRR, trace systolic murmur? Abdomen:  Not distended, soft, non-tender. No rebound or rigidity.   Lower extremities: no pretibial edema bilaterally  Skin: Exposed areas without rash. Not pale. Not jaundice Neurologic:  alert & oriented X3.  Speech normal, gait appropriate for age and unassisted Strength symmetric and appropriate for age.  Psych: Cognition and judgment appear intact.  Cooperative with normal attention span and concentration.  Behavior appropriate. No anxious or depressed appearing.     Assessment     Assessment HTN -on fosinopril for a while, HCTZ change to chlorthalidone 03-2015. H/o RAD   H/o freq PVCs, dx remotely, holter in the 90s   PLAN Here for CPX HTN: has been on fosinopril for a while.  At some point, HCTZ changed to chlorthalidone and eventually amlodipine added. BP is  not as well-controlled as desired, exam show a trace of systolic murmur, no abdominal bruit. EKG today: NSR Plan:  Labs, echo (trace murmur noted) Increase amlodipine from 2.5 to 5mg , continue chlorthalidone 25, fosinopril 40. Consider cards referral  If he remains not well controlled on 3 meds  RTC 3 months  This visit occurred during the SARS-CoV-2 public health emergency.  Safety protocols were in place, including screening questions prior to the visit, additional usage of staff PPE, and extensive cleaning of exam room while observing appropriate contact time as indicated for disinfecting solutions.

## 2020-03-29 ENCOUNTER — Other Ambulatory Visit (INDEPENDENT_AMBULATORY_CARE_PROVIDER_SITE_OTHER): Payer: BC Managed Care – PPO

## 2020-03-29 ENCOUNTER — Encounter: Payer: Self-pay | Admitting: Internal Medicine

## 2020-03-29 DIAGNOSIS — I10 Essential (primary) hypertension: Secondary | ICD-10-CM

## 2020-03-29 LAB — CBC WITH DIFFERENTIAL/PLATELET
Basophils Absolute: 0.1 10*3/uL (ref 0.0–0.1)
Basophils Relative: 1 % (ref 0.0–3.0)
Eosinophils Absolute: 0.2 10*3/uL (ref 0.0–0.7)
Eosinophils Relative: 3.7 % (ref 0.0–5.0)
HCT: 45 % (ref 39.0–52.0)
Hemoglobin: 15.3 g/dL (ref 13.0–17.0)
Lymphocytes Relative: 34 % (ref 12.0–46.0)
Lymphs Abs: 1.9 10*3/uL (ref 0.7–4.0)
MCHC: 34 g/dL (ref 30.0–36.0)
MCV: 89.7 fl (ref 78.0–100.0)
Monocytes Absolute: 0.7 10*3/uL (ref 0.1–1.0)
Monocytes Relative: 12.5 % — ABNORMAL HIGH (ref 3.0–12.0)
Neutro Abs: 2.7 10*3/uL (ref 1.4–7.7)
Neutrophils Relative %: 48.8 % (ref 43.0–77.0)
Platelets: 286 10*3/uL (ref 150.0–400.0)
RBC: 5.02 Mil/uL (ref 4.22–5.81)
RDW: 13 % (ref 11.5–15.5)
WBC: 5.5 10*3/uL (ref 4.0–10.5)

## 2020-03-29 LAB — COMPREHENSIVE METABOLIC PANEL
ALT: 32 U/L (ref 0–53)
AST: 28 U/L (ref 0–37)
Albumin: 4.9 g/dL (ref 3.5–5.2)
Alkaline Phosphatase: 39 U/L (ref 39–117)
BUN: 17 mg/dL (ref 6–23)
CO2: 31 mEq/L (ref 19–32)
Calcium: 9.7 mg/dL (ref 8.4–10.5)
Chloride: 94 mEq/L — ABNORMAL LOW (ref 96–112)
Creatinine, Ser: 0.92 mg/dL (ref 0.40–1.50)
GFR: 88.88 mL/min (ref >60.00)
Glucose, Bld: 91 mg/dL (ref 70–99)
Potassium: 3.9 mEq/L (ref 3.5–5.1)
Sodium: 132 mEq/L — ABNORMAL LOW (ref 135–145)
Total Bilirubin: 0.6 mg/dL (ref 0.2–1.2)
Total Protein: 7.3 g/dL (ref 6.0–8.3)

## 2020-03-29 LAB — TSH: TSH: 1.34 u[IU]/mL (ref 0.35–4.50)

## 2020-03-29 NOTE — Assessment & Plan Note (Signed)
Here for CPX HTN: has been on fosinopril for a while.  At some point, HCTZ changed to chlorthalidone and eventually amlodipine added. BP is not as well-controlled as desired, exam show a trace of systolic murmur, no abdominal bruit. EKG today: NSR Plan:  Labs, echo (trace murmur noted) Increase amlodipine from 2.5 to 5mg , continue chlorthalidone 25, fosinopril 40. Consider cards referral  If he remains not well controlled on 3 meds  RTC 3 months

## 2020-03-29 NOTE — Assessment & Plan Note (Signed)
-  Td 07/2014 - covid vax: pfizer x2 and moderna x 1 - flu shot 11/2019 - ZOX:[id number] 2012 reportedly normal, due for colonoscopy, states he will arrange. - FH Prostate ca: DRE 09/2017 wnl, last PSA normal, no symptoms, no FH.  Reassess next year -Labs: declines FLP ; CMP CBC TSH  -Doing great with lifestyle delete

## 2020-03-30 ENCOUNTER — Other Ambulatory Visit: Payer: Self-pay | Admitting: Internal Medicine

## 2020-03-31 ENCOUNTER — Encounter: Payer: Self-pay | Admitting: Internal Medicine

## 2020-04-25 ENCOUNTER — Other Ambulatory Visit: Payer: Self-pay | Admitting: Internal Medicine

## 2020-04-25 ENCOUNTER — Encounter: Payer: Self-pay | Admitting: Internal Medicine

## 2020-04-25 MED ORDER — ZALEPLON 10 MG PO CAPS: 10.0000 mg | ORAL_CAPSULE | Freq: Every evening | ORAL | 0 refills | Status: DC | PRN

## 2020-04-25 NOTE — Telephone Encounter (Signed)
Requesting: zaleplon (Sonata) 10mg  Contract: none UDS: none Last Visit: 03/28/20 Next Visit: None Last Refill: 09/24/2017 #90 and 1RF Pt sig: 1 tab qhs prn  Please Advise

## 2020-05-19 ENCOUNTER — Encounter: Payer: Self-pay | Admitting: Internal Medicine

## 2020-06-09 ENCOUNTER — Other Ambulatory Visit: Payer: Self-pay | Admitting: Internal Medicine

## 2020-07-10 DIAGNOSIS — M722 Plantar fascial fibromatosis: Secondary | ICD-10-CM | POA: Diagnosis not present

## 2020-07-31 ENCOUNTER — Other Ambulatory Visit: Payer: Self-pay | Admitting: Internal Medicine

## 2020-08-10 ENCOUNTER — Other Ambulatory Visit: Payer: Self-pay | Admitting: Internal Medicine

## 2020-08-10 ENCOUNTER — Encounter: Payer: Self-pay | Admitting: Internal Medicine

## 2020-08-10 MED ORDER — AZELASTINE HCL 0.1 % NA SOLN: 2.0000 | Freq: Two times a day (BID) | NASAL | 6 refills | Status: DC

## 2020-08-11 DIAGNOSIS — L03112 Cellulitis of left axilla: Secondary | ICD-10-CM | POA: Diagnosis not present

## 2020-08-14 ENCOUNTER — Encounter: Payer: Self-pay | Admitting: Internal Medicine

## 2020-08-15 ENCOUNTER — Other Ambulatory Visit: Payer: Self-pay

## 2020-08-15 ENCOUNTER — Encounter: Payer: Self-pay | Admitting: Internal Medicine

## 2020-08-15 ENCOUNTER — Ambulatory Visit (INDEPENDENT_AMBULATORY_CARE_PROVIDER_SITE_OTHER): Payer: BC Managed Care – PPO | Admitting: Internal Medicine

## 2020-08-15 VITALS — BP 142/80 | HR 68 | Temp 98.2°F | Resp 16 | Ht 72.0 in | Wt 232.5 lb

## 2020-08-15 DIAGNOSIS — L03119 Cellulitis of unspecified part of limb: Secondary | ICD-10-CM

## 2020-08-15 DIAGNOSIS — Z09 Encounter for follow-up examination after completed treatment for conditions other than malignant neoplasm: Secondary | ICD-10-CM

## 2020-08-15 MED ORDER — LEVOFLOXACIN 500 MG PO TABS: 500.0000 mg | ORAL_TABLET | Freq: Every day | ORAL | 0 refills | Status: AC

## 2020-08-15 NOTE — Progress Notes (Signed)
Subjective:    Patient ID: Daniel Lyons, male    DOB: 12-Oct-1955, 65 y.o.   MRN: [id number]  DOS:  08/15/2020 Type of visit - description: Acute visit 10 days ago, the patient got splinter at the left hand  (palm), his wife remove it with a needle. Subsequently he went to the beach. About 5 days ago, he noted the palm to be red and the next day he noticed some red streaks. Went to urgent care, was Rx clindamycin. Initially he improved a little but yesterday he noticed more swelling and some edema at the fourth and fifth left digits.  Review of Systems No fever chills No discharge other than above he feels well  Past Medical History:  Diagnosis Date   Elevated LFTs    Giant cell tumor    removal   Heart murmur    frequent PVCs   Hypertension    Reactive airway disease    "long ago"    Past Surgical History:  Procedure Laterality Date   finger tumor removal     L index, giant cell tumor    VASECTOMY  1994    Allergies as of 08/15/2020   No Known Allergies      Medication List        Accurate as of August 15, 2020  4:43 PM. If you have any questions, ask your nurse or doctor.          albuterol 108 (90 Base) MCG/ACT inhaler Commonly known as: Ventolin HFA Inhale 2 puffs into the lungs every 6 (six) hours as needed for wheezing or shortness of breath.   amLODipine 5 MG tablet Commonly known as: NORVASC TAKE 1 TABLET(5 MG) BY MOUTH DAILY   azelastine 0.1 % nasal spray Commonly known as: ASTELIN Place 2 sprays into both nostrils 2 (two) times daily.   chlorthalidone 25 MG tablet Commonly known as: HYGROTON TAKE 1 TABLET(25 MG) BY MOUTH DAILY   clindamycin 300 MG capsule Commonly known as: CLEOCIN Take 300 mg by mouth 3 (three) times daily.   fosinopril 20 MG tablet Commonly known as: MONOPRIL Take 2 tablets (40 mg total) by mouth daily.   VITAMIN D3 PO Take 1 capsule by mouth daily.   zaleplon 10 MG capsule Commonly known as: SONATA Take 1 capsule  (10 mg total) by mouth at bedtime as needed for sleep.           Objective:   Physical Exam BP (!) 142/80 (BP Location: Left Arm, Patient Position: Sitting, Cuff Size: Normal)   Pulse 68   Temp 98.2 F (36.8 C) (Oral)   Resp 16   Ht 6' (1.829 m)   Wt 232 lb 8 oz (105.5 kg)   SpO2 98%   BMI 31.53 kg/m  General:   Well developed, NAD, BMI noted. HEENT:  Normocephalic . Face symmetric, atraumatic Hands: R: Normal L: Very subtle redness at the at the base of the fourth finger.  At the dorsum there is also some subtle redness swelling, minimal tenderness to palpation.  No abscess, no openings. Lower extremities: no pretibial edema bilaterally  Skin: Not pale. Not jaundice Neurologic:  alert & oriented X3.  Speech normal, gait appropriate for age and unassisted Psych--  Cognition and judgment appear intact.  Cooperative with normal attention span and concentration.  Behavior appropriate. No anxious or depressed appearing.       Assessment      Assessment HTN -on fosinopril for a while, HCTZ change to  chlorthalidone 03-2015. 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  I must think about vibrio vulnificus.  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 Plan: Cover for vibrio with Levaquin, definitely call if not gradually better in the next 48 hours.  Seek immediate attention if rapid worsening, fever, chills, etc.  He verbalized understanding.  This visit occurred during the SARS-CoV-2 public health emergency.  Safety protocols were in place, including screening questions prior to the visit, additional usage of staff PPE, and extensive cleaning of exam room while observing appropriate contact time as indicated for disinfecting solutions.

## 2020-08-15 NOTE — Patient Instructions (Signed)
Take antibiotic as prescribed  Definitely let me know or seek medical attention if the swelling on redness continue increasing or if you develop fever or chills.

## 2020-08-17 ENCOUNTER — Telehealth: Payer: Self-pay

## 2020-08-17 NOTE — Assessment & Plan Note (Signed)
L Hand cellulitis: As described above, in the context of being recently at the [facility].  I must think about vibrio vulnificus.  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 Plan: Cover for vibrio with Levaquin, definitely call if not gradually better in the next 48 hours.  Seek immediate attention if rapid worsening, fever, chills, etc.  He verbalized understanding.

## 2020-08-17 NOTE — Telephone Encounter (Signed)
Received OV note from [facility] in [place], [place] on 08/11/20. OV note placed in PCP red folder for review.

## 2020-08-17 NOTE — Telephone Encounter (Signed)
Notes reviewed, was Rx clindamycin

## 2020-08-27 ENCOUNTER — Other Ambulatory Visit: Payer: Self-pay | Admitting: Internal Medicine

## 2020-09-21 DIAGNOSIS — M1811 Unilateral primary osteoarthritis of first carpometacarpal joint, right hand: Secondary | ICD-10-CM | POA: Diagnosis not present

## 2020-09-21 DIAGNOSIS — M25521 Pain in right elbow: Secondary | ICD-10-CM | POA: Diagnosis not present

## 2020-09-21 DIAGNOSIS — M7711 Lateral epicondylitis, right elbow: Secondary | ICD-10-CM | POA: Diagnosis not present

## 2020-09-21 DIAGNOSIS — M79644 Pain in right finger(s): Secondary | ICD-10-CM | POA: Diagnosis not present

## 2020-10-01 ENCOUNTER — Other Ambulatory Visit: Payer: Self-pay | Admitting: Internal Medicine

## 2020-10-02 ENCOUNTER — Other Ambulatory Visit: Payer: Self-pay | Admitting: Internal Medicine

## 2020-11-02 ENCOUNTER — Encounter: Payer: Self-pay | Admitting: Internal Medicine

## 2020-11-02 ENCOUNTER — Other Ambulatory Visit: Payer: Self-pay | Admitting: Internal Medicine

## 2020-11-02 MED ORDER — TRAZODONE HCL 50 MG PO TABS: 50.0000 mg | ORAL_TABLET | Freq: Every evening | ORAL | 0 refills | Status: DC | PRN

## 2020-11-14 ENCOUNTER — Encounter: Payer: Self-pay | Admitting: Internal Medicine

## 2020-11-15 ENCOUNTER — Encounter: Payer: Self-pay | Admitting: Internal Medicine

## 2020-11-15 MED ORDER — CHLORTHALIDONE 25 MG PO TABS: 25.0000 mg | ORAL_TABLET | Freq: Every day | ORAL | 1 refills | Status: DC

## 2020-11-17 ENCOUNTER — Telehealth: Payer: BC Managed Care – PPO | Admitting: Internal Medicine

## 2020-11-17 ENCOUNTER — Other Ambulatory Visit: Payer: Self-pay

## 2020-12-28 ENCOUNTER — Encounter: Payer: Self-pay | Admitting: Internal Medicine

## 2020-12-29 ENCOUNTER — Telehealth: Payer: BC Managed Care – PPO | Admitting: Physician Assistant

## 2020-12-29 DIAGNOSIS — J019 Acute sinusitis, unspecified: Secondary | ICD-10-CM

## 2020-12-29 DIAGNOSIS — B9689 Other specified bacterial agents as the cause of diseases classified elsewhere: Secondary | ICD-10-CM

## 2020-12-29 MED ORDER — AMOXICILLIN-POT CLAVULANATE 875-125 MG PO TABS: 1.0000 | ORAL_TABLET | Freq: Two times a day (BID) | ORAL | 0 refills | Status: DC

## 2020-12-29 NOTE — Progress Notes (Signed)

## 2021-02-08 HISTORY — PX: OTHER SURGICAL HISTORY: SHX169

## 2021-02-16 ENCOUNTER — Other Ambulatory Visit: Payer: Self-pay | Admitting: Internal Medicine

## 2021-02-16 NOTE — Telephone Encounter (Signed)
Can you verify Pt is to be on trazodone and zaleplon please?

## 2021-02-16 NOTE — Telephone Encounter (Signed)
See message from 11/02/2020, Read Drivers was not helping, he was recommended trazodone. Advise patient: Needs one or the other medication, not both. Let me know what is his preference

## 2021-02-19 ENCOUNTER — Other Ambulatory Visit: Payer: Self-pay | Admitting: Internal Medicine

## 2021-03-03 ENCOUNTER — Encounter (HOSPITAL_BASED_OUTPATIENT_CLINIC_OR_DEPARTMENT_OTHER): Payer: Self-pay

## 2021-03-03 ENCOUNTER — Emergency Department (HOSPITAL_BASED_OUTPATIENT_CLINIC_OR_DEPARTMENT_OTHER): Payer: BC Managed Care – PPO

## 2021-03-03 ENCOUNTER — Other Ambulatory Visit: Payer: Self-pay

## 2021-03-03 ENCOUNTER — Emergency Department (HOSPITAL_BASED_OUTPATIENT_CLINIC_OR_DEPARTMENT_OTHER)
Admission: EM | Admit: 2021-03-03 | Discharge: 2021-03-03 | Disposition: A | Discharge status: Home / Self Care | Payer: BC Managed Care – PPO | Attending: Emergency Medicine | Admitting: Emergency Medicine

## 2021-03-03 DIAGNOSIS — M1812 Unilateral primary osteoarthritis of first carpometacarpal joint, left hand: Secondary | ICD-10-CM | POA: Diagnosis not present

## 2021-03-03 DIAGNOSIS — Z79899 Other long term (current) drug therapy: Secondary | ICD-10-CM | POA: Diagnosis not present

## 2021-03-03 DIAGNOSIS — S61012A Laceration without foreign body of left thumb without damage to nail, initial encounter: Secondary | ICD-10-CM | POA: Diagnosis not present

## 2021-03-03 DIAGNOSIS — W268XXA Contact with other sharp object(s), not elsewhere classified, initial encounter: Secondary | ICD-10-CM | POA: Diagnosis not present

## 2021-03-03 DIAGNOSIS — I10 Essential (primary) hypertension: Secondary | ICD-10-CM | POA: Insufficient documentation

## 2021-03-03 DIAGNOSIS — Y9301 Activity, walking, marching and hiking: Secondary | ICD-10-CM | POA: Insufficient documentation

## 2021-03-03 DIAGNOSIS — S6992XA Unspecified injury of left wrist, hand and finger(s), initial encounter: Secondary | ICD-10-CM | POA: Diagnosis not present

## 2021-03-03 DIAGNOSIS — Z23 Encounter for immunization: Secondary | ICD-10-CM | POA: Insufficient documentation

## 2021-03-03 MED ORDER — TETANUS-DIPHTH-ACELL PERTUSSIS 5-2.5-18.5 LF-MCG/0.5 IM SUSY
0.5000 mL | PREFILLED_SYRINGE | Freq: Once | INTRAMUSCULAR | Status: AC
  Administered 2021-03-03: 10:00:00 0.5 mL via INTRAMUSCULAR
  Filled 2021-03-03: qty 0.5

## 2021-03-03 MED ORDER — LIDOCAINE HCL (PF) 1 % IJ SOLN
15.0000 mL | Freq: Once | INTRAMUSCULAR | Status: AC
  Administered 2021-03-03: 11:00:00 15 mL
  Filled 2021-03-03: qty 15

## 2021-03-03 MED ORDER — CEPHALEXIN 500 MG PO CAPS: 500.0000 mg | ORAL_CAPSULE | Freq: Three times a day (TID) | ORAL | 0 refills | Status: AC

## 2021-03-03 NOTE — ED Triage Notes (Signed)
Pt was crawling under crawlspace and hit left thumb on wire band. Laceration to thumb. Bleeding controlled. Sensation/pulses intact. Last tetanus 2015.

## 2021-03-03 NOTE — ED Provider Notes (Signed)
[facility] EMERGENCY DEPARTMENT Provider Note   CSN: [id number] Arrival date & time: 03/03/21  0906     History Chief Complaint  Patient presents with   Extremity Laceration    Daniel Lyons is a 65 y.o. male.  65 year old male presents today for evaluation of laceration to left thumb that occurred while he was in his car off space.  Patient reports a subtle course patient was he was walking he tripped and cut his thumb on a piece of metal ribbon.  He denies other injuries.  Last tetanus shot was 7 years ago.  The history is provided by the patient. No language interpreter was used.      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 04/19/2019   Insomnia 09/24/2017   Disorder of rotator cuff syndrome of left shoulder and allied disorder 09/23/2016   AC (acromioclavicular) arthritis 09/23/2016   Biceps tendinitis of right shoulder 05/13/2016   PCP NOTES >>>>>>>>>>>>>>>>>>>>>>>>> 05/03/2015   Annual physical exam 05/03/2015   Hypertension    Reactive airway disease     Past Surgical History:  Procedure Laterality Date   finger tumor removal     L index, giant cell tumor    VASECTOMY  1994       Family History  Problem Relation Age of Onset   Prostate cancer Father 20   Pancreatic cancer Father    Prostate cancer Paternal Uncle    CAD Neg Hx    Diabetes Neg Hx    Colon cancer Neg Hx     Social History   Tobacco Use   Smoking status: Never   Smokeless tobacco: Never  Substance Use Topics   Alcohol use: Yes    Alcohol/week: 0.0 standard drinks    Comment: 12 oz wine daily   Drug use: No    Home Medications Prior to Admission medications   Medication Sig Start Date End Date Taking? Authorizing  Provider  traZODone (DESYREL) 50 MG tablet TAKE 1 TABLET(50 MG) BY MOUTH AT BEDTIME AS NEEDED FOR SLEEP 02/19/21   Colon Branch, MD  zaleplon (SONATA) 10 MG capsule TAKE 1 CAPSULE(10 MG) BY MOUTH AT BEDTIME AS NEEDED FOR SLEEP 02/19/21   Colon Branch, MD  albuterol (VENTOLIN HFA) 108 (90 Base) MCG/ACT inhaler Inhale 2 puffs into the lungs every 6 (six) hours as needed for wheezing or shortness of breath. Patient not taking: Reported on 08/15/2020 05/04/18   Colon Branch, MD  amLODipine (NORVASC) 5 MG tablet Take 1 tablet (5 mg total) by mouth daily. 08/28/20   Colon Branch, MD  amoxicillin-clavulanate (AUGMENTIN) 875-125 MG tablet Take 1 tablet by mouth 2 (two) times daily. 12/29/20   Mar Daring, PA-C  azelastine (ASTELIN) 0.1 % nasal spray Place 2 sprays into both nostrils 2 (two) times daily. 08/10/20   Colon Branch, MD  chlorthalidone (HYGROTON) 25 MG tablet Take 1 tablet (25 mg total) by mouth daily. 11/15/20   Colon Branch, MD  Cholecalciferol (VITAMIN D3 PO) Take 1 capsule by mouth daily.    [provider]  fosinopril (MONOPRIL) 20 MG tablet TAKE 2 TABLETS(40 MG) BY MOUTH  DAILY 10/03/20   Colon Branch, MD    Allergies    Patient has no known allergies.  Review of Systems   Review of Systems  Skin:  Positive for wound.  Neurological:  Negative for weakness and numbness.   Physical Exam Updated Vital Signs BP (!) 169/92 (BP Location: Right Arm)    Pulse 67    Temp 97.7 F (36.5 C) (Oral)    Resp 18    Ht 6' (1.829 m)    Wt 102.5 kg    SpO2 99%    BMI 30.65 kg/m   Physical Exam Vitals and nursing note reviewed.  Constitutional:      General: He is not in acute distress.    Appearance: Normal appearance. He is not ill-appearing.  HENT:     Head: Normocephalic and atraumatic.     Nose: Nose normal.  Eyes:     Conjunctiva/sclera: Conjunctivae normal.  Pulmonary:     Effort: Pulmonary effort is normal. No respiratory distress.  Musculoskeletal:        General: No deformity.      Comments: Left thumb with full range of motion.  Intact sensation.  Good capillary refill.  Radial pulse 2+.  All digits on left hand with full range of motion.  Skin:    Findings: No rash.  Neurological:     Mental Status: He is alert.    ED Results / Procedures / Treatments   Labs (all labs ordered are listed, but only abnormal results are displayed) Labs Reviewed - No data to display  EKG None  Radiology No results found.  Procedures .Marland KitchenLaceration Repair  Date/Time: 03/03/2021 11:36 AM Performed by: Evlyn Courier, PA-C Authorized by: Evlyn Courier, PA-C   Consent:    Consent obtained:  Verbal   Consent given by:  Patient   Risks, benefits, and alternatives were discussed: yes     Risks discussed:  Infection, pain, tendon damage, nerve damage and vascular damage Universal protocol:    Procedure explained and questions answered to patient or proxy's satisfaction: yes     Patient identity confirmed:  Verbally with patient Anesthesia:    Anesthesia method:  Nerve block   Block anesthetic:  Lidocaine 1% w/o epi   Block injection procedure:  Incremental injection and negative aspiration for blood   Block outcome:  Anesthesia achieved Laceration details:    Location:  Finger   Finger location:  R thumb   Length (cm):  2 Pre-procedure details:    Preparation:  Imaging obtained to evaluate for foreign bodies Exploration:    Hemostasis achieved with:  Direct pressure   Imaging obtained: x-ray     Imaging outcome: foreign body not noted     Wound extent: areolar tissue violated and fascia violated     Wound extent: no nerve damage noted, no tendon damage noted and no underlying fracture noted   Treatment:    Area cleansed with:  Povidone-iodine   Amount of cleaning:  Extensive   Irrigation solution:  Sterile saline   Irrigation method:  Syringe   Visualized foreign bodies/material removed: no     Undermining:  Minimal Skin repair:    Repair method:  Sutures   Suture  size:  5-0   Suture material:  Prolene   Suture technique:  Simple interrupted   Number of sutures:  4 Approximation:    Approximation:  Close Repair type:    Repair type:  Simple Post-procedure details:    Dressing:  Non-adherent dressing  and splint for protection   Procedure completion:  Tolerated well, no immediate complications   Medications Ordered in ED Medications - No data to display  ED Course  I have reviewed the triage vital signs and the nursing notes.  Pertinent labs & imaging results that were available during my care of the patient were reviewed by me and considered in my medical decision making (see chart for details).    MDM Rules/Calculators/A&P                         65 year old male presents with left thumb laceration that occurred this morning while patient was in a crawl space.  Neurovascularly intact.  Range of motion intact.  4 stitches placed.  Return precautions discussed.  Wound care discussed.  Patient voices understanding and is in agreement with plan.    Final Clinical Impression(s) / ED Diagnoses Final diagnoses:  Laceration of left thumb without foreign body without damage to nail, initial encounter    Rx / DC Orders ED Discharge Orders     None        Evlyn Courier, PA-C 03/03/21 [place], [place], DO 03/03/21 1212

## 2021-03-03 NOTE — ED Notes (Signed)
Provider at bedside

## 2021-03-03 NOTE — Discharge Instructions (Addendum)
you had 4 stitches placed today.  These will need to come out in about 7 to 10 days.  If you have any signs of infection including worsening redness, drainage from the site please return for evaluation.  He can have these removed at your primary care provider's office, urgent care or you can return to an emergency room.  I sent in antibiotics to your pharmacy.

## 2021-03-22 ENCOUNTER — Other Ambulatory Visit: Payer: Self-pay | Admitting: Internal Medicine

## 2021-04-04 ENCOUNTER — Other Ambulatory Visit: Payer: Self-pay | Admitting: Internal Medicine

## 2021-04-10 DIAGNOSIS — L821 Other seborrheic keratosis: Secondary | ICD-10-CM | POA: Diagnosis not present

## 2021-04-10 DIAGNOSIS — L578 Other skin changes due to chronic exposure to nonionizing radiation: Secondary | ICD-10-CM | POA: Diagnosis not present

## 2021-05-11 ENCOUNTER — Encounter: Payer: Self-pay | Admitting: Internal Medicine

## 2021-06-12 LAB — COMPREHENSIVE METABOLIC PANEL
Albumin: 4.7 (ref 3.5–5.0)
Calcium: 9.3 (ref 8.7–10.7)
Globulin: 2.2
eGFR: 96

## 2021-06-12 LAB — HEPATIC FUNCTION PANEL
ALT: 30 U/L (ref 10–40)
AST: 26 (ref 14–40)
Alkaline Phosphatase: 48 (ref 25–125)
Bilirubin, Total: 0.3

## 2021-06-12 LAB — CBC AND DIFFERENTIAL
HCT: 48 (ref 41–53)
Hemoglobin: 16.1 (ref 13.5–17.5)
Neutrophils Absolute: 2.2
Platelets: 282 10*3/uL (ref 150–400)
WBC: 4.2

## 2021-06-12 LAB — BASIC METABOLIC PANEL
BUN: 12 (ref 4–21)
CO2: 23 — AB (ref 13–22)
Chloride: 96 — AB (ref 99–108)
Creatinine: 0.9 (ref 0.6–1.3)
Glucose: 94
Potassium: 4.7 mEq/L (ref 3.5–5.1)
Sodium: 137 (ref 137–147)

## 2021-06-12 LAB — PSA: PSA: 0.7

## 2021-06-12 LAB — CBC: RBC: 5.26 — AB (ref 3.87–5.11)

## 2021-06-14 ENCOUNTER — Telehealth (HOSPITAL_COMMUNITY): Payer: Self-pay | Admitting: Emergency Medicine

## 2021-06-14 NOTE — Telephone Encounter (Signed)
Calling patient after I got a message inquiring about a CT angiography. I called the patient back to ask questions- he didn't have an order placed for this test that he was requesting for him and his wife for risk stratification. I informed him that typically these tests are ordered by cardiology offices after consults but we could perform a $99 calcium score CT. The patient responded that he knew what a cal score CT was and that it was less specific and would rather shop around to other sites that would offer the CT angiography service without a cardiologist involved as a walk-in service. ? ?I thanked the patient for his time. ? ?Marchia Bond RN Navigator Cardiac Imaging ?[facility] ?[phone] Office  ?[phone] Cell ? ?

## 2021-06-15 ENCOUNTER — Other Ambulatory Visit: Payer: Self-pay | Admitting: Internal Medicine

## 2021-06-18 ENCOUNTER — Telehealth: Payer: Self-pay | Admitting: Internal Medicine

## 2021-06-18 NOTE — Telephone Encounter (Signed)
Called patient to let them know we needed to resched the appt they made on mychart ?Looks like it was suppose to be a physical & it needs to be schedule it a correct time slot ? ?Please schedule patient for a CPE at his convince he is over due(last date 1.18.22) ?

## 2021-06-20 ENCOUNTER — Ambulatory Visit: Payer: BC Managed Care – PPO | Admitting: Internal Medicine

## 2021-06-25 ENCOUNTER — Encounter: Payer: Self-pay | Admitting: Internal Medicine

## 2021-06-25 ENCOUNTER — Ambulatory Visit (INDEPENDENT_AMBULATORY_CARE_PROVIDER_SITE_OTHER): Payer: BC Managed Care – PPO | Admitting: Internal Medicine

## 2021-06-25 VITALS — BP 130/82 | HR 88 | Temp 98.3°F | Resp 18 | Ht 72.0 in | Wt 234.0 lb

## 2021-06-25 DIAGNOSIS — Z0000 Encounter for general adult medical examination without abnormal findings: Secondary | ICD-10-CM

## 2021-06-25 NOTE — Progress Notes (Signed)
? ?Subjective:  ? ? Patient ID: Daniel Lyons, male    DOB: [birth year], 66 y.o.   MRN: [id number] ? ?DOS:  06/25/2021 ?Type of visit - description: CPX ? ?In general feeling well. ?Last year had some stress, had some difficulty sleeping, fortunately that is much better and he feels well. ? ? ?Wt Readings from Last 3 Encounters:  ?06/25/21 234 lb (106.1 kg)  ?03/03/21 226 lb (102.5 kg)  ?08/15/20 232 lb 8 oz (105.5 kg)  ? ? ?Review of Systems ? ?Other than above, a 14 point review of systems is negative  ? ?  ? ?Past Medical History:  ?Diagnosis Date  ? Elevated LFTs   ? Giant cell tumor   ? removal  ? Heart murmur   ? frequent PVCs  ? Hypertension   ? Reactive airway disease   ? "long ago"  ? ? ?Past Surgical History:  ?Procedure Laterality Date  ? finger tumor removal    ? L index, giant cell tumor   ? thumb injury Left 02/2021  ? VASECTOMY  03/11/1992  ? ?Social History  ? ?Socioeconomic History  ? Marital status: Married  ?  Spouse name: Not on file  ? Number of children: 3  ? Years of education: Not on file  ? Highest education level: Not on file  ?Occupational History  ? Occupation: Ecologist  @ [employer]  ?Tobacco Use  ? Smoking status: Never  ? Smokeless tobacco: Never  ?Substance and Sexual Activity  ? Alcohol use: Yes  ?  Alcohol/week: 0.0 standard drinks  ?  Comment: 12 oz wine daily  ? Drug use: No  ? Sexual activity: Not on file  ?Other Topics Concern  ? Not on file  ?Social History Narrative  ? Household: pt and wife   ? ?Social Determinants of Health  ? ?Financial Resource Strain: Not on file  ?Food Insecurity: Not on file  ?Transportation Needs: Not on file  ?Physical Activity: Not on file  ?Stress: Not on file  ?Social Connections: Not on file  ?Intimate Partner Violence: Not on file  ? ? ?Current Outpatient Medications  ?Medication Instructions  ? albuterol (VENTOLIN HFA) 108 (90 Base) MCG/ACT inhaler 2 puffs, Inhalation, Every 6 hours PRN  ? amLODipine (NORVASC) 5 MG tablet TAKE 1 TABLET(5  MG) BY MOUTH DAILY  ? azelastine (ASTELIN) 0.1 % nasal spray 2 sprays, Each Nare, 2 times daily  ? chlorthalidone (HYGROTON) 25 mg, Oral, Daily  ? Cholecalciferol (VITAMIN D3 PO) 1 capsule, Oral, Daily  ? fosinopril (MONOPRIL) 20 MG tablet TAKE 2 TABLETS(40 MG) BY MOUTH DAILY  ? traZODone (DESYREL) 50 MG tablet TAKE 1 TABLET(50 MG) BY MOUTH AT BEDTIME AS NEEDED FOR SLEEP  ? zaleplon (SONATA) 10 MG capsule TAKE 1 CAPSULE(10 MG) BY MOUTH AT BEDTIME AS NEEDED FOR SLEEP  ? ? ?   ?Objective:  ? Physical Exam ?BP 130/82 (BP Location: Left Arm, Patient Position: Sitting, Cuff Size: Normal)   Pulse 88   Temp 98.3 ?F (36.8 ?C) (Oral)   Resp 18   Ht 6' (1.829 m)   Wt 234 lb (106.1 kg)   SpO2 96%   BMI 31.74 kg/m?  ?General: ?Well developed, NAD, BMI noted ?Neck: No  thyromegaly  ?HEENT:  ?Normocephalic . Face symmetric, atraumatic ?Lungs:  ?CTA B ?Normal respiratory effort, no intercostal retractions, no accessory muscle use. ?Heart: RRR, question of soft systolic murmur at the right side of the sternum ?Abdomen:  ?Not distended, soft, non-tender.  No rebound or rigidity.   ?Lower extremities: no pretibial edema bilaterally ?DRE: Normal sphincter tone, brown stools, prostate normal ?Skin: Exposed areas without rash. Not pale. Not jaundice ?Neurologic:  ?alert & oriented X3.  ?Speech normal, gait appropriate for age and unassisted ?Strength symmetric and appropriate for age.  ?Psych: ?Cognition and judgment appear intact.  ?Cooperative with normal attention span and concentration.  ?Behavior appropriate. ?No anxious or depressed appearing. ? ?   ?Assessment   ? ? Assessment ?HTN -on fosinopril for a while, HCTZ change to chlorthalidone 03-2015. ?H/o RAD   ?H/o freq PVCs, dx remotely, holter in the 90s ? ? ?PLAN ?Here for CPX ?HTN: Good med compliance, BP is okay, recent labs normal.  No change, RF as needed ?Insomnia: Develop insomnia last year, still has available trazodone and Sonata, he takes meds  sporadically. ?Question of systolic murmur: See physical exam, he is very active bicycling, no symptoms, reassess next year. ?RTC 1 year ?  ?  ? ?This visit occurred during the SARS-CoV-2 public health emergency.  Safety protocols were in place, including screening questions prior to the visit, additional usage of staff PPE, and extensive cleaning of exam room while observing appropriate contact time as indicated for disinfecting solutions.  ? ?

## 2021-06-25 NOTE — Patient Instructions (Addendum)
Check the  blood pressure regularly ?BP GOAL is between 110/65 and  135/85. ?If it is consistently higher or lower, let me know ? ? ?Watch your diet closely ?  ? ? ?GO TO THE FRONT DESK, PLEASE SCHEDULE YOUR APPOINTMENTS ?Come back for   a physical exam in 1 year ?

## 2021-06-26 ENCOUNTER — Encounter: Payer: Self-pay | Admitting: Internal Medicine

## 2021-06-26 DIAGNOSIS — E785 Hyperlipidemia, unspecified: Secondary | ICD-10-CM

## 2021-06-26 NOTE — Assessment & Plan Note (Addendum)
-  Td 07/2014 - covid vax: Booster is a  consideration, declines - Shingrix info provided - CCS: Colonoscopy 2012 reportedly normal, due for colonoscopy, has a cscope schedule for next month - FH Prostate ca: No symptoms, DRE today negative, last PSA normal. - Labs from 06/12/2021: CBC, CMP, PSA: All normal. -Had a advance lipid panel elsewhere recently , it was ok; states he will not take the statins.  Offered a Coronary Ca score to further assess his CV risk, he will let me know if interested. - ACP discussed

## 2021-06-26 NOTE — Assessment & Plan Note (Signed)
Here for CPX ?HTN: Good med compliance, BP is okay, recent labs normal.  No change, RF as needed ?Insomnia: Develop insomnia last year, still has available trazodone and Sonata, he takes meds sporadically. ?Question of systolic murmur: See physical exam, he is very active bicycling, no symptoms, reassess next year. ?RTC 1 year ?

## 2021-09-07 ENCOUNTER — Inpatient Hospital Stay: Admission: RE | Admit: 2021-09-07 | Payer: BC Managed Care – PPO | Source: Ambulatory Visit

## 2021-09-18 ENCOUNTER — Telehealth: Payer: Self-pay | Admitting: Internal Medicine

## 2021-09-18 MED ORDER — ZALEPLON 10 MG PO CAPS: 10.0000 mg | ORAL_CAPSULE | Freq: Every evening | ORAL | 1 refills | Status: DC | PRN

## 2021-09-18 NOTE — Telephone Encounter (Signed)
PDMP okay, Rx sent 

## 2021-09-18 NOTE — Telephone Encounter (Signed)
Requesting: Zaleplon '10mg'$   Contract: None UDS: None Last Visit: 06/25/21 Next Visit: None Last Refill: 02/19/21 #30 and 0RF  Please Advise

## 2021-09-19 ENCOUNTER — Other Ambulatory Visit: Payer: Self-pay | Admitting: Internal Medicine

## 2021-09-20 ENCOUNTER — Other Ambulatory Visit: Payer: Self-pay | Admitting: Internal Medicine

## 2021-10-04 ENCOUNTER — Encounter: Payer: Self-pay | Admitting: Internal Medicine

## 2021-10-04 ENCOUNTER — Telehealth: Payer: Self-pay | Admitting: Internal Medicine

## 2021-10-04 ENCOUNTER — Ambulatory Visit (INDEPENDENT_AMBULATORY_CARE_PROVIDER_SITE_OTHER): Payer: Self-pay

## 2021-10-04 DIAGNOSIS — I7789 Other specified disorders of arteries and arterioles: Secondary | ICD-10-CM

## 2021-10-04 DIAGNOSIS — I2510 Atherosclerotic heart disease of native coronary artery without angina pectoris: Secondary | ICD-10-CM

## 2021-10-04 DIAGNOSIS — Q245 Malformation of coronary vessels: Secondary | ICD-10-CM

## 2021-10-04 DIAGNOSIS — E785 Hyperlipidemia, unspecified: Secondary | ICD-10-CM

## 2021-10-04 MED ORDER — ATORVASTATIN CALCIUM 40 MG PO TABS: 40.0000 mg | ORAL_TABLET | Freq: Every day | ORAL | 0 refills | Status: DC

## 2021-10-04 NOTE — Addendum Note (Signed)
Addended byDamita Dunnings D on: 10/04/2021 01:58 PM   Modules accepted: Orders

## 2021-10-04 NOTE — Telephone Encounter (Signed)
FLP ordered, aspirin added to med list. Lipitor '40mg'$  sent to pharmacy. Cardiology referral placed.

## 2021-10-04 NOTE — Telephone Encounter (Signed)
Enter FLP order to be done tomorrow at [facility].  DX CAD Will start aspirin 81 mg daily Send Lipitor 40 mg daily, 54-monthsupply.  Enter a cardiology referral: Dx CAD per coronary calcium score, enlarged thoracic aorta. ==== Results reviewed with the patient,  Results: Thoracic aorta 4.6 cm; Calcium coronary score high at 1539. He remains asymptomatic even after exertion, plan as described above.  He reported to me that recently had an advanced lipid panel and it was "okay"  thus was not on statins.

## 2021-10-08 ENCOUNTER — Encounter: Payer: Self-pay | Admitting: Internal Medicine

## 2021-10-08 DIAGNOSIS — Q245 Malformation of coronary vessels: Secondary | ICD-10-CM

## 2021-10-08 DIAGNOSIS — I7789 Other specified disorders of arteries and arterioles: Secondary | ICD-10-CM

## 2021-10-09 ENCOUNTER — Other Ambulatory Visit (INDEPENDENT_AMBULATORY_CARE_PROVIDER_SITE_OTHER): Payer: BC Managed Care – PPO

## 2021-10-09 DIAGNOSIS — I2510 Atherosclerotic heart disease of native coronary artery without angina pectoris: Secondary | ICD-10-CM | POA: Diagnosis not present

## 2021-10-09 LAB — LIPID PANEL
Cholesterol: 169 mg/dL (ref 0–200)
HDL: 61.1 mg/dL (ref >39.00)
LDL Cholesterol: 97 mg/dL (ref 0–99)
NonHDL: 108
Total CHOL/HDL Ratio: 3
Triglycerides: 56 mg/dL (ref 0.0–149.0)
VLDL: 11.2 mg/dL (ref 0.0–40.0)

## 2021-10-10 ENCOUNTER — Encounter: Payer: Self-pay | Admitting: Internal Medicine

## 2021-10-26 ENCOUNTER — Ambulatory Visit: Payer: BC Managed Care – PPO | Admitting: Internal Medicine

## 2021-11-05 ENCOUNTER — Encounter: Payer: Self-pay | Admitting: Internal Medicine

## 2021-11-07 ENCOUNTER — Other Ambulatory Visit: Payer: Self-pay | Admitting: Internal Medicine

## 2021-11-22 ENCOUNTER — Encounter: Payer: Self-pay | Admitting: Internal Medicine

## 2021-11-22 ENCOUNTER — Ambulatory Visit: Payer: BC Managed Care – PPO | Admitting: Internal Medicine

## 2021-11-22 ENCOUNTER — Ambulatory Visit: Payer: BC Managed Care – PPO | Attending: Internal Medicine | Admitting: Internal Medicine

## 2021-11-22 VITALS — BP 130/80 | HR 90 | Ht 72.0 in | Wt 227.2 lb

## 2021-11-22 DIAGNOSIS — I10 Essential (primary) hypertension: Secondary | ICD-10-CM

## 2021-11-22 DIAGNOSIS — I2510 Atherosclerotic heart disease of native coronary artery without angina pectoris: Secondary | ICD-10-CM

## 2021-11-22 DIAGNOSIS — I350 Nonrheumatic aortic (valve) stenosis: Secondary | ICD-10-CM

## 2021-11-22 DIAGNOSIS — E785 Hyperlipidemia, unspecified: Secondary | ICD-10-CM | POA: Diagnosis not present

## 2021-11-22 DIAGNOSIS — I7121 Aneurysm of the ascending aorta, without rupture: Secondary | ICD-10-CM

## 2021-11-22 DIAGNOSIS — I700 Atherosclerosis of aorta: Secondary | ICD-10-CM | POA: Diagnosis not present

## 2021-11-22 NOTE — Progress Notes (Addendum)
Cardiology Office Note:    Date:  11/22/2021   ID:  Daniel Lyons, DOB Sep 02, 1955, MRN [id number]  PCP:  Colon Branch, MD   [facility] Providers Cardiologist:  Lenna Sciara, MD Referring MD: Colon Branch, MD   Chief Complaint/Reason for Referral: Elevated calcium score, aortic calcification, and ascending aortic aneurysm  ASSESSMENT:    1. Coronary artery calcification seen on CAT scan   2. Aortic atherosclerosis ([place])   3. Primary hypertension   4. Hyperlipidemia LDL goal <70   5. Aneurysm of ascending aorta without rupture ([place])   6. Aortic stenosis, moderate     PLAN:    In order of problems listed above: 1.  Coronary artery calcification: Continue aspirin, statin, and strict blood pressure control.  Follow-up in 9 months or earlier if needed. 2.  Aortic atherosclerosis: See discussion above. 3.  Hypertension: Blood pressures well controlled.  He checks his blood pressures on a regular basis at home. 4.  Hyperlipidemia: LDL goal is now less than 70.  Last lipid panel done recently demonstrated an LDL of 97.  Increase atorvastatin to 40 mg and check lipid panel, LFTs, and LP(a) in 2 months. 5.  Ascending aortic aneurysm: We will obtain CTA in 6 months and then determine frequency of surveillance thereafter. 6.  Aortic stenosis: Based on calcium score the patient's aortic stenosis is likely moderate.  We will obtain an echocardiogram to evaluate further.             Dispo:  Return in about 9 months (around 08/23/2022).      Medication Adjustments/Labs and Tests Ordered: Current medicines are reviewed at length with the patient today.  Concerns regarding medicines are outlined above.  The following changes have been made:  no change   Labs/tests ordered: Orders Placed This Encounter  Procedures   CT ANGIO CHEST AORTA W/CM & OR WO/CM   Lipoprotein A (LPA)   Lipid panel   Hepatic function panel   EKG 12-Lead    Medication Changes: No orders of the defined  types were placed in this encounter.    Current medicines are reviewed at length with the patient today.  The patient does not have concerns regarding medicines.   History of Present Illness:    FOCUSED PROBLEM LIST:   1.  Hypertension 2.  Coronary artery calcification on calcium score CT 2023 3.  Moderate aortic valve calcification on calcium score CT 2023 4.  Ascending aortic aneurysm of 4.6 cm on calcium score CT 2023 5.  Hyperlipidemia  The patient is a 66 y.o. male with the indicated medical history here for for recommendations regarding elevated calcium score, moderate aortic calcification, and ascending aortic aneurysm seen on calcium score CT for screening purposes.  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.  He exercises on a very regular basis on his Peloton.  He can achieve a VO2 max of 40 on the Peloton.  He denies any exertional chest pain, exertional dyspnea, presyncope syncope, palpitations, paroxysmal nocturnal dyspnea, or orthopnea.  Calcium score was done for screening purposes and he is here to discuss those results.  Current Medications: Current Meds  Medication Sig   albuterol (VENTOLIN HFA) 108 (90 Base) MCG/ACT inhaler Inhale 2 puffs into the lungs every 6 (six) hours as needed for wheezing or shortness of breath.   amLODipine (NORVASC) 5 MG tablet Take 1 tablet (5 mg total) by mouth daily.  aspirin EC 81 MG tablet Take 81 mg by mouth daily. Swallow whole.   atorvastatin (LIPITOR) 40 MG tablet Take 1 tablet (40 mg total) by mouth at bedtime.   chlorthalidone (HYGROTON) 25 MG tablet TAKE 1 TABLET(25 MG) BY MOUTH DAILY   Cholecalciferol (VITAMIN D3 PO) Take 1 capsule by mouth daily.   fosinopril (MONOPRIL) 20 MG tablet TAKE 2 TABLETS(40 MG) BY MOUTH DAILY   traZODone (DESYREL) 50 MG tablet TAKE 1 TABLET(50 MG) BY MOUTH AT BEDTIME AS NEEDED FOR SLEEP   zaleplon (SONATA) 10 MG capsule Take 1 capsule (10 mg total) by mouth at  bedtime as needed for sleep.   [DISCONTINUED] azelastine (ASTELIN) 0.1 % nasal spray Place 2 sprays into both nostrils 2 (two) times daily.     Allergies:    Patient has no known allergies.   Social History:   Social History   Tobacco Use   Smoking status: Never   Smokeless tobacco: Never  Substance Use Topics   Alcohol use: Yes    Alcohol/week: 0.0 standard drinks of alcohol    Comment: 12 oz wine daily   Drug use: No     Family Hx: Family History  Problem Relation Age of Onset   Prostate cancer Father 32   Pancreatic cancer Father    Prostate cancer Paternal Uncle    CAD Neg Hx    Diabetes Neg Hx    Colon cancer Neg Hx      Review of Systems:   Please see the history of present illness.    All other systems reviewed and are negative.     EKGs/Labs/Other Test Reviewed:    EKG:  EKG performed 2022 that I personally reviewed demonstrates sinus rhythm; EKG performed today that I personally reviewed demonstrates EKG demonstrates normal sinus rhythm.  Prior CV studies:  Calcium score CT 2023: 1. Coronary calcium score of 1539. This was 96th percentile for age, gender, and race matched controls. 2. Aortic atherosclerosis. 3. Evidence of moderate ascending aortic dilation, 45 mm, on non-contrasted study. Consider secondary imaging modality (echocardiogram, CTA Aorta Protocol, MRA Aorta Protocol) if clinically indicated. 4. Aortic valve calcium score 1235.  Other studies Reviewed: Review of the additional studies/records demonstrates: None relevant  Recent Labs: 06/12/2021: ALT 30; BUN 12; Creatinine 0.9; Hemoglobin 16.1; Platelets 282; Potassium 4.7; Sodium 137   Recent Lipid Panel Lab Results  Component Value Date/Time   CHOL 169 10/09/2021 12:37 PM   TRIG 56.0 10/09/2021 12:37 PM   HDL 61.10 10/09/2021 12:37 PM   LDLCALC 97 10/09/2021 12:37 PM    Risk Assessment/Calculations:                Physical Exam:    VS:  BP 130/80   Pulse 90   Ht 6'  (1.829 m)   Wt 227 lb 3.2 oz (103.1 kg)   SpO2 97%   BMI 30.81 kg/m    Wt Readings from Last 3 Encounters:  11/22/21 227 lb 3.2 oz (103.1 kg)  06/25/21 234 lb (106.1 kg)  03/03/21 226 lb (102.5 kg)    GENERAL:  No apparent distress, AOx3 HEENT:  No carotid bruits, +2 carotid impulses, no scleral icterus CAR: RRR 2/6 SEM, without gallops, rubs, or thrills RES:  Clear to auscultation bilaterally ABD:  Soft, nontender, nondistended, positive bowel sounds x 4 VASC:  +2 radial pulses, +2 carotid pulses, palpable pedal pulses NEURO:  CN 2-12 grossly intact; motor and sensory grossly intact PSYCH:  No active depression or anxiety  EXT:  No edema, ecchymosis, or cyanosis  Signed, Early Osmond, MD  11/22/2021 4:29 PM    [address], [place], [place]  [address] Phone: [phone]; Fax: [phone]   Note:  This document was prepared using Dragon voice recognition software and may include unintentional dictation errors.

## 2021-11-22 NOTE — Patient Instructions (Signed)
Medication Instructions:  Your physician recommends that you continue on your current medications as directed. Please refer to the Current Medication list given to you today.  *If you need a refill on your cardiac medications before your next appointment, please call your pharmacy*  Lab Work: IN TWO MONTHS: Fasting lipids, LFT's, LPa If you have labs (blood work) drawn today and your tests are completely normal, you will receive your results only by: [place] (if you have MyChart) OR A paper copy in the mail If you have any lab test that is abnormal or we need to change your treatment, we will call you to review the results.  Testing/Procedures: Your provider recommends that you have a Chest CTA scan in 6 months.   Follow-Up: At [facility], you and your health needs are our priority.  As part of our continuing mission to provide you with exceptional heart care, we have created designated Provider Care Teams.  These Care Teams include your primary Cardiologist (physician) and Advanced Practice Providers (APPs -  Physician Assistants and Nurse Practitioners) who all work together to provide you with the care you need, when you need it.  Your next appointment:   9 month(s)  The format for your next appointment:   In Person  Provider:   Early Osmond, MD

## 2022-01-09 ENCOUNTER — Other Ambulatory Visit: Payer: Self-pay | Admitting: Internal Medicine

## 2022-02-04 ENCOUNTER — Other Ambulatory Visit: Payer: BC Managed Care – PPO

## 2022-02-08 ENCOUNTER — Encounter: Payer: Self-pay | Admitting: Internal Medicine

## 2022-02-08 ENCOUNTER — Other Ambulatory Visit: Payer: Self-pay | Admitting: Internal Medicine

## 2022-03-28 ENCOUNTER — Other Ambulatory Visit: Payer: BC Managed Care – PPO

## 2022-04-15 ENCOUNTER — Other Ambulatory Visit: Payer: Self-pay | Admitting: Internal Medicine

## 2022-05-09 ENCOUNTER — Telehealth: Payer: Self-pay | Admitting: Internal Medicine

## 2022-05-09 DIAGNOSIS — I350 Nonrheumatic aortic (valve) stenosis: Secondary | ICD-10-CM

## 2022-05-09 NOTE — Telephone Encounter (Signed)
Per Dr. Dara Hoyer last [place] note from 11/22/2021 and as copied below, he wanted the pt to have a CT Angio Chest Aorta done in 6 months, to better assess ascending aortic aneurysm and further surveillance from that point.  Looks like the order for the CT ANGIO CHEST AORTA has already been placed for the pt to have done now and just requires scheduling.  I will send a staff message to [facility] Scheduling pool to call the pt and arrange for this appt to be done sometime soon.     Will need to route this message to Dr. Ali Lowe and Caren Hazy RN, to further advise if echo is needed at this time or wait until he see's him for his next follow-up appt.     Did leave the pt a message to call the office back to further discuss this matter.    PLAN:     In order of problems listed above: 1.  Coronary artery calcification: Continue aspirin, statin, and strict blood pressure control.  Follow-up in 9 months or earlier if needed. 2.  Aortic atherosclerosis: See discussion above. 3.  Hypertension: Blood pressures well controlled.  He checks his blood pressures on a regular basis at home. 4.  Hyperlipidemia: LDL goal is now less than 70.  Last lipid panel done recently demonstrated an LDL of 97.  Increase atorvastatin to 40 mg and check lipid panel, LFTs, and LP(a) in 2 months. 5.  Ascending aortic aneurysm: We will obtain CTA in 6 months and then determine frequency of surveillance thereafter. 6.  Aortic stenosis: Based on calcium score the patient's aortic stenosis is likely moderate.  We will obtain an echocardiogram to evaluate further.

## 2022-05-09 NOTE — Telephone Encounter (Signed)
Order for echo also placed on this pt.  Dx:  aortic stenosis.  Will send Echo Scheduler a staff message to call the pt back to arrange for him to have an echo sometime soon.

## 2022-05-09 NOTE — Telephone Encounter (Signed)
Echo is scheduled for 06/05/22.

## 2022-05-09 NOTE — Telephone Encounter (Signed)
  Patient sent a message through [place] requesting a follow up appointment and stated that he is supposed to have an Echo done again in March. Can orders be placed?

## 2022-05-20 ENCOUNTER — Encounter: Payer: Self-pay | Admitting: Internal Medicine

## 2022-05-20 MED ORDER — AZITHROMYCIN 250 MG PO TABS: ORAL_TABLET | ORAL | 0 refills | Status: DC

## 2022-05-21 ENCOUNTER — Telehealth: Payer: Self-pay | Admitting: Internal Medicine

## 2022-05-21 MED ORDER — ALBUTEROL SULFATE HFA 108 (90 BASE) MCG/ACT IN AERS: 2.0000 | INHALATION_SPRAY | Freq: Four times a day (QID) | RESPIRATORY_TRACT | 5 refills | Status: DC | PRN

## 2022-05-21 MED ORDER — ZALEPLON 10 MG PO CAPS: 10.0000 mg | ORAL_CAPSULE | Freq: Every evening | ORAL | 3 refills | Status: DC | PRN

## 2022-05-21 NOTE — Telephone Encounter (Signed)
PDMP okay, Rx sent 

## 2022-05-21 NOTE — Telephone Encounter (Signed)
Requesting: zaleplon '10mg'$   Contract: None UDS: None Last Visit: 06/25/21 Next Visit: None Last Refill: 02/08/22 #30 and 0RF   Please Advise

## 2022-05-21 NOTE — Addendum Note (Signed)
Addended byDamita Dunnings D on: 05/21/2022 02:21 PM   Modules accepted: Orders

## 2022-05-21 NOTE — Telephone Encounter (Signed)
Rx sent 

## 2022-05-21 NOTE — Telephone Encounter (Signed)
Albuterol 2 puffs 4 times daily as needed, 1 and 2 refills

## 2022-05-29 ENCOUNTER — Other Ambulatory Visit: Payer: Self-pay

## 2022-05-29 ENCOUNTER — Ambulatory Visit: Payer: Medicare HMO | Attending: Internal Medicine

## 2022-05-29 DIAGNOSIS — I7121 Aneurysm of the ascending aorta, without rupture: Secondary | ICD-10-CM | POA: Diagnosis not present

## 2022-05-29 DIAGNOSIS — I350 Nonrheumatic aortic (valve) stenosis: Secondary | ICD-10-CM | POA: Diagnosis not present

## 2022-05-29 DIAGNOSIS — E785 Hyperlipidemia, unspecified: Secondary | ICD-10-CM

## 2022-05-29 DIAGNOSIS — I2510 Atherosclerotic heart disease of native coronary artery without angina pectoris: Secondary | ICD-10-CM | POA: Diagnosis not present

## 2022-05-29 DIAGNOSIS — I700 Atherosclerosis of aorta: Secondary | ICD-10-CM | POA: Diagnosis not present

## 2022-05-29 DIAGNOSIS — I10 Essential (primary) hypertension: Secondary | ICD-10-CM

## 2022-05-29 MED ORDER — FOSINOPRIL SODIUM 20 MG PO TABS: ORAL_TABLET | ORAL | 0 refills | Status: DC

## 2022-05-29 MED ORDER — CHLORTHALIDONE 25 MG PO TABS: 25.0000 mg | ORAL_TABLET | Freq: Every day | ORAL | 0 refills | Status: DC

## 2022-05-29 MED ORDER — AMLODIPINE BESYLATE 5 MG PO TABS: 5.0000 mg | ORAL_TABLET | Freq: Every day | ORAL | 0 refills | Status: DC

## 2022-05-29 MED ORDER — ATORVASTATIN CALCIUM 40 MG PO TABS: 40.0000 mg | ORAL_TABLET | Freq: Every day | ORAL | 0 refills | Status: DC

## 2022-05-29 MED ORDER — TRAZODONE HCL 50 MG PO TABS: ORAL_TABLET | ORAL | 0 refills | Status: DC

## 2022-05-30 ENCOUNTER — Telehealth: Payer: Self-pay

## 2022-05-30 LAB — LIPID PANEL
Chol/HDL Ratio: 2.9 ratio (ref 0.0–5.0)
Cholesterol, Total: 190 mg/dL (ref 100–199)
HDL: 66 mg/dL (ref >39)
LDL Chol Calc (NIH): 115 mg/dL — ABNORMAL HIGH (ref 0–99)
Triglycerides: 48 mg/dL (ref 0–149)
VLDL Cholesterol Cal: 9 mg/dL (ref 5–40)

## 2022-05-30 LAB — HEPATIC FUNCTION PANEL
ALT: 28 IU/L (ref 0–44)
AST: 24 IU/L (ref 0–40)
Albumin: 4.7 g/dL (ref 3.9–4.9)
Alkaline Phosphatase: 55 IU/L (ref 44–121)
Bilirubin Total: 0.4 mg/dL (ref 0.0–1.2)
Bilirubin, Direct: 0.12 mg/dL (ref 0.00–0.40)
Total Protein: 7.1 g/dL (ref 6.0–8.5)

## 2022-05-30 LAB — LIPOPROTEIN A (LPA): Lipoprotein (a): 27.1 nmol/L (ref <75.0)

## 2022-05-30 NOTE — Telephone Encounter (Signed)
Patient is aware of lab results and provider recommendations. He verbalized understanding.

## 2022-05-30 NOTE — Telephone Encounter (Signed)
-----   Message from Early Osmond, MD sent at 05/30/2022  7:38 AM EDT ----- Increase atorva to 80 and check lipids and LFTs in 2 months

## 2022-06-05 ENCOUNTER — Ambulatory Visit (HOSPITAL_COMMUNITY): Payer: Medicare HMO | Attending: Cardiology

## 2022-06-05 DIAGNOSIS — I350 Nonrheumatic aortic (valve) stenosis: Secondary | ICD-10-CM | POA: Insufficient documentation

## 2022-06-05 LAB — ECHOCARDIOGRAM COMPLETE
AR max vel: 1.62 cm2
AV Area VTI: 1.47 cm2
AV Area mean vel: 1.48 cm2
AV Mean grad: 12.3 mmHg
AV Peak grad: 23.5 mmHg
Ao pk vel: 2.42 m/s
Area-P 1/2: 3.81 cm2
S' Lateral: 3.2 cm

## 2022-07-12 DIAGNOSIS — K648 Other hemorrhoids: Secondary | ICD-10-CM | POA: Diagnosis not present

## 2022-07-12 DIAGNOSIS — Z1211 Encounter for screening for malignant neoplasm of colon: Secondary | ICD-10-CM | POA: Diagnosis not present

## 2022-07-12 LAB — HM COLONOSCOPY

## 2022-08-23 DIAGNOSIS — Z85828 Personal history of other malignant neoplasm of skin: Secondary | ICD-10-CM | POA: Diagnosis not present

## 2022-08-23 DIAGNOSIS — K130 Diseases of lips: Secondary | ICD-10-CM | POA: Diagnosis not present

## 2022-08-23 DIAGNOSIS — C44619 Basal cell carcinoma of skin of left upper limb, including shoulder: Secondary | ICD-10-CM | POA: Diagnosis not present

## 2022-08-23 DIAGNOSIS — D1801 Hemangioma of skin and subcutaneous tissue: Secondary | ICD-10-CM | POA: Diagnosis not present

## 2022-08-23 DIAGNOSIS — L814 Other melanin hyperpigmentation: Secondary | ICD-10-CM | POA: Diagnosis not present

## 2022-08-24 ENCOUNTER — Other Ambulatory Visit: Payer: Self-pay | Admitting: Internal Medicine

## 2022-08-28 ENCOUNTER — Other Ambulatory Visit: Payer: Self-pay | Admitting: Internal Medicine

## 2022-09-04 ENCOUNTER — Encounter: Payer: Self-pay | Admitting: Internal Medicine

## 2022-09-21 ENCOUNTER — Telehealth: Payer: Medicare HMO | Admitting: Family Medicine

## 2022-09-21 DIAGNOSIS — T63484A Toxic effect of venom of other arthropod, undetermined, initial encounter: Secondary | ICD-10-CM | POA: Diagnosis not present

## 2022-09-21 MED ORDER — PREDNISONE 10 MG (21) PO TBPK
ORAL_TABLET | ORAL | 0 refills | Status: DC
Start: 2022-09-21 — End: 2022-10-15

## 2022-09-21 NOTE — Progress Notes (Signed)
E-Visit for Insect Sting  Thank you for describing the insect sting for [place].  Here is how we plan to help!  This is something that also needs to be assessed in person for long course of antibiotics. We can send a prednisone taper to help with symptoms of inflammation and pain, however, you will need to schedule a follow up visit with your provider or be seen in person for further evaluation of a need for long term antibiotics.    The 2 greatest risks from insect stings are allergic reaction, which can be fatal in some people and infection, which is more common and less serious.  Bees, wasps, yellow jackets, and hornets belong to a class of insects called Hymenoptera.  Most insect stings cause only minor discomfort.  Stings can happen anywhere on the body and can be painful.  Most stings are from honey bees or yellow jackets.  Fire ants can sting multiple times.  The sites of the stings are more likely to become infected.      What can be used to prevent Insect Stings?  Insect repellant with at least 20% DEET.  Wearing long pants and shirts with socks and shoes.  Wear dark or drab-colored clothes rather than bright colors.  Avoid using perfumes and hair sprays; these attract insects.  HOME CARE ADVICE:  1. Stinger removal: The stinger looks like a tiny black dot in the sting. Use a fingernail, credit card edge, or knife-edge to scrape it off.  Don't pull it out because it squeezes out more venom. If the stinger is below the skin surface, leave it alone.  It will be shed with normal skin healing. 2. Use cold compresses to the area of the sting for 10-20 minutes.  You may repeat this as needed to relieve symptoms of pain and swelling. 3.  For pain relief, take acetominophen 650 mg 4-6 hours as needed or ibuprofen 400 mg every 6-8 hours as needed or naproxen 250-500 mg every 12 hours as needed. 4.  You can also use hydrocortisone cream 0.5% or 1% up to 4 times daily as needed for  itching. 5.  If the sting becomes very itchy, take Benadryl 25-50 mg, follow directions on box. 6.  Wash the area 2-3 times daily with antibacterial soap and warm water. 7. Call your Doctor if: Fever, a severe headache, or rash occur in the next 2 weeks. Sting area begins to look infected. Redness and swelling worsens after home treatment. Your current symptoms become worse.    MAKE SURE YOU:  Understand these instructions. Will watch your condition. Will get help right away if you are not doing well or get worse.  Thank you for choosing an e-visit.  Your e-visit answers were reviewed by a board certified advanced clinical practitioner to complete your personal care plan. Depending upon the condition, your plan could have included both over the counter or prescription medications.  Please review your pharmacy choice. Make sure the pharmacy is open so you can pick up prescription now. If there is a problem, you may contact your provider through [employer] and have the prescription routed to another pharmacy.  Your safety is important to [place]. If you have drug allergies check your prescription carefully.   For the next 24 hours you can use MyChart to ask questions about today's visit, request a non-urgent call back, or ask for a work or school excuse. You will get an email in the next two days asking about your  experience. I hope that your e-visit has been valuable and will speed your recovery.  I have spent 5 minutes in review of e-visit questionnaire, review and updating patient chart, medical decision making and response to patient.   Reed Pandy, PA-C

## 2022-09-25 ENCOUNTER — Other Ambulatory Visit: Payer: Self-pay | Admitting: Internal Medicine

## 2022-09-30 ENCOUNTER — Other Ambulatory Visit: Payer: Self-pay | Admitting: Internal Medicine

## 2022-10-11 ENCOUNTER — Other Ambulatory Visit: Payer: Self-pay | Admitting: Internal Medicine

## 2022-10-14 NOTE — Progress Notes (Unsigned)
    Aleen Sells D.[facility] [address] [place] [address] Phone: [phone]   Assessment and Plan:     There are no diagnoses linked to this encounter.  ***   Pertinent previous records reviewed include ***   Follow Up: ***     Subjective:   I, Latreshia Beauchaine, am serving as a Neurosurgeon for Doctor Richardean Sale  Chief Complaint: left elbow pain   HPI:  10/15/2022 Patient is a 67 year old male complaining of left elbow pain. Patient states  Relevant Historical Information: ***  Additional pertinent review of systems negative.   Current Outpatient Medications:    albuterol (VENTOLIN HFA) 108 (90 Base) MCG/ACT inhaler, Inhale 2 puffs into the lungs every 6 (six) hours as needed for wheezing or shortness of breath., Disp: 18 g, Rfl: 5   amLODipine (NORVASC) 5 MG tablet, Take 1 tablet (5 mg total) by mouth daily., Disp: 30 tablet, Rfl: 0   aspirin EC 81 MG tablet, Take 81 mg by mouth daily. Swallow whole., Disp: , Rfl:    atorvastatin (LIPITOR) 40 MG tablet, Take 1 tablet (40 mg total) by mouth at bedtime., Disp: 90 tablet, Rfl: 0   azithromycin (ZITHROMAX Z-PAK) 250 MG tablet, 2 tabs a day the first day, then 1 tab a day x 4 days, Disp: 6 tablet, Rfl: 0   chlorthalidone (HYGROTON) 25 MG tablet, Take 1 tablet (25 mg total) by mouth daily., Disp: 90 tablet, Rfl: 0   Cholecalciferol (VITAMIN D3 PO), Take 1 capsule by mouth daily., Disp: , Rfl:    fosinopril (MONOPRIL) 20 MG tablet, Take 2 tablets (40 mg total) by mouth daily., Disp: 60 tablet, Rfl: 0   predniSONE (STERAPRED UNI-PAK 21 TAB) 10 MG (21) TBPK tablet, Take following package directions., Disp: 21 tablet, Rfl: 0   traZODone (DESYREL) 50 MG tablet, Take 1 tablet (50 mg total) by mouth at bedtime as needed for sleep., Disp: 30 tablet, Rfl: 0   zaleplon (SONATA) 10 MG capsule, Take 1 capsule (10 mg total) by mouth at bedtime as needed for sleep., Disp: 30 capsule, Rfl: 3    Objective:     There were no vitals filed for this visit.    There is no height or weight on file to calculate BMI.    Physical Exam:    ***   Electronically signed by:  Aleen Sells D.[facility] 11:06 AM 10/14/22

## 2022-10-15 ENCOUNTER — Ambulatory Visit: Payer: Medicare HMO | Admitting: Sports Medicine

## 2022-10-15 ENCOUNTER — Ambulatory Visit (INDEPENDENT_AMBULATORY_CARE_PROVIDER_SITE_OTHER): Payer: Medicare HMO

## 2022-10-15 VITALS — BP 132/82 | HR 81 | Ht 72.0 in

## 2022-10-15 DIAGNOSIS — M25522 Pain in left elbow: Secondary | ICD-10-CM | POA: Diagnosis not present

## 2022-10-15 DIAGNOSIS — M19022 Primary osteoarthritis, left elbow: Secondary | ICD-10-CM | POA: Diagnosis not present

## 2022-10-15 MED ORDER — MELOXICAM 15 MG PO TABS: 15.0000 mg | ORAL_TABLET | Freq: Every day | ORAL | 0 refills | Status: DC

## 2022-10-15 NOTE — Patient Instructions (Signed)
-   Start meloxicam 15 mg daily x2 weeks.  If still having pain after 2 weeks, complete 3rd-week of meloxicam. May use remaining meloxicam as needed once daily for pain control.  Do not to use additional NSAIDs while taking meloxicam.  May use Tylenol [phone] mg 2 to 3 times a day for breakthrough pain. Elbow HEP  As needed follow up if no improvement 3-4 week follow up

## 2022-11-04 ENCOUNTER — Encounter: Payer: Self-pay | Admitting: Internal Medicine

## 2022-11-04 ENCOUNTER — Ambulatory Visit (INDEPENDENT_AMBULATORY_CARE_PROVIDER_SITE_OTHER): Payer: Medicare HMO | Admitting: Internal Medicine

## 2022-11-04 VITALS — BP 126/80 | HR 64 | Temp 98.0°F | Resp 16 | Ht 72.0 in | Wt 231.2 lb

## 2022-11-04 DIAGNOSIS — S56212A Strain of other flexor muscle, fascia and tendon at forearm level, left arm, initial encounter: Secondary | ICD-10-CM | POA: Diagnosis not present

## 2022-11-04 DIAGNOSIS — Z8042 Family history of malignant neoplasm of prostate: Secondary | ICD-10-CM

## 2022-11-04 DIAGNOSIS — E785 Hyperlipidemia, unspecified: Secondary | ICD-10-CM

## 2022-11-04 DIAGNOSIS — Z0000 Encounter for general adult medical examination without abnormal findings: Secondary | ICD-10-CM | POA: Diagnosis not present

## 2022-11-04 LAB — CBC WITH DIFFERENTIAL/PLATELET
Basophils Absolute: 0 10*3/uL (ref 0.0–0.1)
Basophils Relative: 0.9 % (ref 0.0–3.0)
Eosinophils Absolute: 0.2 10*3/uL (ref 0.0–0.7)
Eosinophils Relative: 4.8 % (ref 0.0–5.0)
HCT: 42.1 % (ref 39.0–52.0)
Hemoglobin: 13.9 g/dL (ref 13.0–17.0)
Lymphocytes Relative: 35.3 % (ref 12.0–46.0)
Lymphs Abs: 1.4 10*3/uL (ref 0.7–4.0)
MCHC: 33.1 g/dL (ref 30.0–36.0)
MCV: 92.3 fl (ref 78.0–100.0)
Monocytes Absolute: 0.5 10*3/uL (ref 0.1–1.0)
Monocytes Relative: 12.5 % — ABNORMAL HIGH (ref 3.0–12.0)
Neutro Abs: 1.8 10*3/uL (ref 1.4–7.7)
Neutrophils Relative %: 46.5 % (ref 43.0–77.0)
Platelets: 261 10*3/uL (ref 150.0–400.0)
RBC: 4.56 Mil/uL (ref 4.22–5.81)
RDW: 13.4 % (ref 11.5–15.5)
WBC: 3.9 10*3/uL — ABNORMAL LOW (ref 4.0–10.5)

## 2022-11-04 LAB — LIPID PANEL
Cholesterol: 208 mg/dL — ABNORMAL HIGH (ref 0–200)
HDL: 65.7 mg/dL (ref >39.00)
LDL Cholesterol: 131 mg/dL — ABNORMAL HIGH (ref 0–99)
NonHDL: 142.74
Total CHOL/HDL Ratio: 3
Triglycerides: 61 mg/dL (ref 0.0–149.0)
VLDL: 12.2 mg/dL (ref 0.0–40.0)

## 2022-11-04 LAB — COMPREHENSIVE METABOLIC PANEL
ALT: 22 U/L (ref 0–53)
AST: 17 U/L (ref 0–37)
Albumin: 4.4 g/dL (ref 3.5–5.2)
Alkaline Phosphatase: 48 U/L (ref 39–117)
BUN: 18 mg/dL (ref 6–23)
CO2: 28 mEq/L (ref 19–32)
Calcium: 9.2 mg/dL (ref 8.4–10.5)
Chloride: 99 mEq/L (ref 96–112)
Creatinine, Ser: 0.84 mg/dL (ref 0.40–1.50)
GFR: 91.49 mL/min (ref >60.00)
Glucose, Bld: 94 mg/dL (ref 70–99)
Potassium: 4.8 mEq/L (ref 3.5–5.1)
Sodium: 135 mEq/L (ref 135–145)
Total Bilirubin: 0.5 mg/dL (ref 0.2–1.2)
Total Protein: 6.8 g/dL (ref 6.0–8.3)

## 2022-11-04 LAB — PSA: PSA: 0.52 ng/mL (ref 0.10–4.00)

## 2022-11-04 NOTE — Assessment & Plan Note (Signed)
Here for CPX   HTN: On amlodipine, chlorthalidone, fosinopril.  Ambulatory BPs 120/80.  No change, labs. Hyperlipidemia: LDL goal less than 70.  Was rec to increase atorvastatin to 80 mg  but still taking 40 mg. has a great lifestyle.  Checking labs, consider increase atorvastatin to 80 or add Zetia. Insomnia: On trazodone, from time to time takes Sonata (twice a month). Cardiovascular: High coronary calcium score, aortic sclerosis, ascending aortic aneurysm Saw cardiology 11-2021, they recommended aspirin, statins and strict BP control.  Due for a follow-up with them, patient plans to send them a message. RTC 1 year.

## 2022-11-04 NOTE — Assessment & Plan Note (Signed)
Here for CPX -Td 07/2020 - vaccines I recommend: Covid vax, PNM 20, RSV, flu shot q fall  - CCS: Colonoscopy 2012 ; 07/2022 Cscope ,next per GI, no polyps per pt - FH Prostate ca: No symptoms,  last PSA normal,will recheck - Labs: CMP FLP CBC PSA -Lifestyle: it is great, exercises 5 times a week plus

## 2022-11-04 NOTE — Patient Instructions (Signed)
Vaccines I recommend: Flu shot this fall Covid booster-new this fall Shingrix (shingles) Prevnar 20 RSV vaccine

## 2022-11-04 NOTE — Progress Notes (Signed)
Subjective:    Patient ID: Daniel Lyons, male    DOB: 07-Jul-1955, 67 y.o.   MRN: [id number]  DOS:  11/04/2022 Type of visit - description: CPX  Here for CPX. Chart extensively reviewed. Feeling great.  Review of Systems   A 14 point review of systems is negative    Past Medical History:  Diagnosis Date   Elevated LFTs    Giant cell tumor    removal   Heart murmur    frequent PVCs   Hypertension    Reactive airway disease    "long ago"    Past Surgical History:  Procedure Laterality Date   finger tumor removal     L index, giant cell tumor    thumb injury Left 02/2021   VASECTOMY  03/11/1992   Social History   Socioeconomic History   Marital status: Married    Spouse name: Not on file   Number of children: 3   Years of education: Not on file   Highest education level: Not on file  Occupational History   Occupation: nurse anesthesist  @ [employer]  Tobacco Use   Smoking status: Never   Smokeless tobacco: Never  Substance and Sexual Activity   Alcohol use: Yes    Alcohol/week: 0.0 standard drinks of alcohol    Comment: 12 oz wine daily   Drug use: No   Sexual activity: Not on file  Other Topics Concern   Not on file  Social History Narrative   Household: pt and wife    Social Determinants of Health   Financial Resource Strain: Not on file  Food Insecurity: Not on file  Transportation Needs: Not on file  Physical Activity: Not on file  Stress: Not on file  Social Connections: Not on file  Intimate Partner Violence: Not on file    Current Outpatient Medications  Medication Instructions   albuterol (VENTOLIN HFA) 108 (90 Base) MCG/ACT inhaler 2 puffs, Inhalation, Every 6 hours PRN   amLODipine (NORVASC) 5 mg, Oral, Daily   aspirin EC 81 mg, Oral, Daily, Swallow whole.   atorvastatin (LIPITOR) 40 mg, Oral, Daily at bedtime   chlorthalidone (HYGROTON) 25 mg, Oral, Daily   fosinopril (MONOPRIL) 40 mg, Oral, Daily   meloxicam (MOBIC) 15 mg, Oral,  Daily   traZODone (DESYREL) 50 mg, Oral, At bedtime PRN   zaleplon (SONATA) 10 mg, Oral, At bedtime PRN       Objective:   Physical Exam BP 126/80   Pulse 64   Temp 98 F (36.7 C) (Oral)   Resp 16   Ht 6' (1.829 m)   Wt 231 lb 4 oz (104.9 kg)   SpO2 98%   BMI 31.36 kg/m  General: Well developed, NAD, BMI noted Neck: No  thyromegaly  HEENT:  Normocephalic . Face symmetric, atraumatic Lungs:  CTA B Normal respiratory effort, no intercostal retractions, no accessory muscle use. Heart: RRR,  no murmur.  Abdomen:  Not distended, soft, non-tender. No rebound or rigidity.   Lower extremities: no pretibial edema bilaterally  Skin: Exposed areas without rash. Not pale. Not jaundice Neurologic:  alert & oriented X3.  Speech normal, gait appropriate for age and unassisted Strength symmetric and appropriate for age.  Psych: Cognition and judgment appear intact.  Cooperative with normal attention span and concentration.  Behavior appropriate. No anxious or depressed appearing.     Assessment      Assessment HTN -on fosinopril for a while, HCTZ change to chlorthalidone 03-2015. Hyperlipidemia:  LDL goal less than 70 10/05/2018: - Calcium coronary score elevated.  (1235) -Aortic sclerosis -Ascending aortic aneurysm: Needs regular CTAs.  Follow-up by cardiology. Echo: 05-2022: Mild aortic stenosis H/o RAD   H/o freq PVCs, dx remotely, holter in the 90s   PLAN Here for CPX -Td 07/2020 - vaccines I recommend: Covid vax, PNM 20, RSV, flu shot q fall  - CCS: Colonoscopy 2012 ; 07/2022 Cscope ,next per GI, no polyps per pt - FH Prostate ca: No symptoms,  last PSA normal,will recheck - Labs: CMP FLP CBC PSA -Lifestyle: it is great, exercises 5 times a week plus HTN: On amlodipine, chlorthalidone, fosinopril.  Ambulatory BPs 120/80.  No change, labs. Hyperlipidemia: LDL goal less than 70.  Was rec to increase atorvastatin to 80 mg  but still taking 40 mg. has a great lifestyle.   Checking labs, consider increase atorvastatin to 80 or add Zetia. Insomnia: On trazodone, from time to time takes Sonata (twice a month). Cardiovascular: High coronary calcium score, aortic sclerosis, ascending aortic aneurysm Saw cardiology 11-2021, they recommended aspirin, statins and strict BP control.  Due for a follow-up with them, patient plans to send them a message. RTC 1 year.

## 2022-11-08 MED ORDER — ATORVASTATIN CALCIUM 80 MG PO TABS: 80.0000 mg | ORAL_TABLET | Freq: Every day | ORAL | 3 refills | Status: DC

## 2022-11-08 NOTE — Addendum Note (Signed)
Addended by: Conrad Alvo D on: 11/08/2022 07:45 AM   Modules accepted: Orders

## 2022-11-12 ENCOUNTER — Other Ambulatory Visit: Payer: Self-pay | Admitting: Sports Medicine

## 2022-11-20 ENCOUNTER — Other Ambulatory Visit: Payer: Self-pay | Admitting: Internal Medicine

## 2022-11-20 MED ORDER — AMLODIPINE BESYLATE 5 MG PO TABS: 5.0000 mg | ORAL_TABLET | Freq: Every day | ORAL | 3 refills | Status: DC

## 2022-11-20 MED ORDER — FOSINOPRIL SODIUM 20 MG PO TABS: 40.0000 mg | ORAL_TABLET | Freq: Every day | ORAL | 3 refills | Status: DC

## 2022-11-26 DIAGNOSIS — M79632 Pain in left forearm: Secondary | ICD-10-CM | POA: Diagnosis not present

## 2022-11-26 DIAGNOSIS — M25522 Pain in left elbow: Secondary | ICD-10-CM | POA: Diagnosis not present

## 2022-12-04 DIAGNOSIS — R69 Illness, unspecified: Secondary | ICD-10-CM | POA: Diagnosis not present

## 2022-12-05 DIAGNOSIS — S56212A Strain of other flexor muscle, fascia and tendon at forearm level, left arm, initial encounter: Secondary | ICD-10-CM | POA: Diagnosis not present

## 2022-12-05 DIAGNOSIS — M25522 Pain in left elbow: Secondary | ICD-10-CM | POA: Diagnosis not present

## 2022-12-05 DIAGNOSIS — M65832 Other synovitis and tenosynovitis, left forearm: Secondary | ICD-10-CM | POA: Diagnosis not present

## 2022-12-16 ENCOUNTER — Encounter: Payer: Self-pay | Admitting: Internal Medicine

## 2022-12-17 MED ORDER — ALBUTEROL SULFATE HFA 108 (90 BASE) MCG/ACT IN AERS: 2.0000 | INHALATION_SPRAY | Freq: Four times a day (QID) | RESPIRATORY_TRACT | 5 refills | Status: AC | PRN

## 2022-12-17 MED ORDER — ZALEPLON 10 MG PO CAPS: 10.0000 mg | ORAL_CAPSULE | Freq: Every evening | ORAL | 1 refills | Status: DC | PRN

## 2022-12-17 NOTE — Telephone Encounter (Signed)
Requesting: zaleplon 10mg  Contract: None UDS: None Last Visit: 11/04/22 Next Visit: None Last Refill: 05/21/22 #30 and 3RF  Please Advise

## 2022-12-17 NOTE — Telephone Encounter (Signed)
PDMP okay, Rx sent 

## 2023-01-01 DIAGNOSIS — I7121 Aneurysm of the ascending aorta, without rupture: Secondary | ICD-10-CM

## 2023-01-09 ENCOUNTER — Encounter: Payer: Self-pay | Admitting: Internal Medicine

## 2023-01-23 ENCOUNTER — Ambulatory Visit (HOSPITAL_COMMUNITY)
Admission: RE | Admit: 2023-01-23 | Discharge: 2023-01-23 | Disposition: A | Discharge status: Home / Self Care | Payer: Medicare HMO | Source: Ambulatory Visit | Attending: Internal Medicine | Admitting: Internal Medicine

## 2023-01-23 DIAGNOSIS — I2510 Atherosclerotic heart disease of native coronary artery without angina pectoris: Secondary | ICD-10-CM | POA: Diagnosis not present

## 2023-01-23 DIAGNOSIS — I719 Aortic aneurysm of unspecified site, without rupture: Secondary | ICD-10-CM | POA: Diagnosis not present

## 2023-01-23 DIAGNOSIS — I771 Stricture of artery: Secondary | ICD-10-CM | POA: Diagnosis not present

## 2023-01-23 DIAGNOSIS — J9811 Atelectasis: Secondary | ICD-10-CM | POA: Diagnosis not present

## 2023-01-23 DIAGNOSIS — I7121 Aneurysm of the ascending aorta, without rupture: Secondary | ICD-10-CM | POA: Diagnosis not present

## 2023-01-23 MED ORDER — IOHEXOL 350 MG/ML SOLN
75.0000 mL | Freq: Once | INTRAVENOUS | Status: AC | PRN
  Administered 2023-01-23: 75 mL via INTRAVENOUS

## 2023-01-26 ENCOUNTER — Encounter: Payer: Self-pay | Admitting: Internal Medicine

## 2023-02-03 ENCOUNTER — Other Ambulatory Visit: Payer: Self-pay | Admitting: *Deleted

## 2023-02-03 DIAGNOSIS — I7121 Aneurysm of the ascending aorta, without rupture: Secondary | ICD-10-CM

## 2023-02-03 NOTE — Progress Notes (Signed)
Per chest CT results reviewed by Dr. Lynnette Caffey - repeat in a year.

## 2023-03-03 ENCOUNTER — Encounter: Payer: Self-pay | Admitting: Pharmacist

## 2023-03-20 ENCOUNTER — Other Ambulatory Visit: Payer: Self-pay | Admitting: Internal Medicine

## 2023-03-20 MED ORDER — TRAZODONE HCL 50 MG PO TABS: 50.0000 mg | ORAL_TABLET | Freq: Every evening | ORAL | 1 refills | Status: DC | PRN

## 2023-03-25 DIAGNOSIS — M2550 Pain in unspecified joint: Secondary | ICD-10-CM | POA: Diagnosis not present

## 2023-03-25 DIAGNOSIS — G479 Sleep disorder, unspecified: Secondary | ICD-10-CM | POA: Diagnosis not present

## 2023-03-25 DIAGNOSIS — E291 Testicular hypofunction: Secondary | ICD-10-CM | POA: Diagnosis not present

## 2023-03-25 DIAGNOSIS — Z6832 Body mass index (BMI) 32.0-32.9, adult: Secondary | ICD-10-CM | POA: Diagnosis not present

## 2023-04-01 ENCOUNTER — Telehealth: Payer: Self-pay | Admitting: Internal Medicine

## 2023-04-01 NOTE — Telephone Encounter (Signed)
Copied from CRM [id number]. Topic: Medicare AWV >> Apr 01, 2023  9:34 AM Payton Doughty wrote: Reason for CRM: Called LVM 04/01/2023 to schedule AWV. Please schedule Virtual or Telehealth visits ONLY.   Verlee Rossetti; Care Guide Ambulatory Clinical Support [facility] [facility] Direct Dial: [phone]

## 2023-04-04 IMAGING — DX DG FINGER THUMB 2+V*L*
3 series · 3 of 3 positions shown · non-contrast
Comparison: None

CLINICAL DATA: LEFT thumb trauma, laceration

EXAM:
LEFT THUMB 2+V

[finger ap]
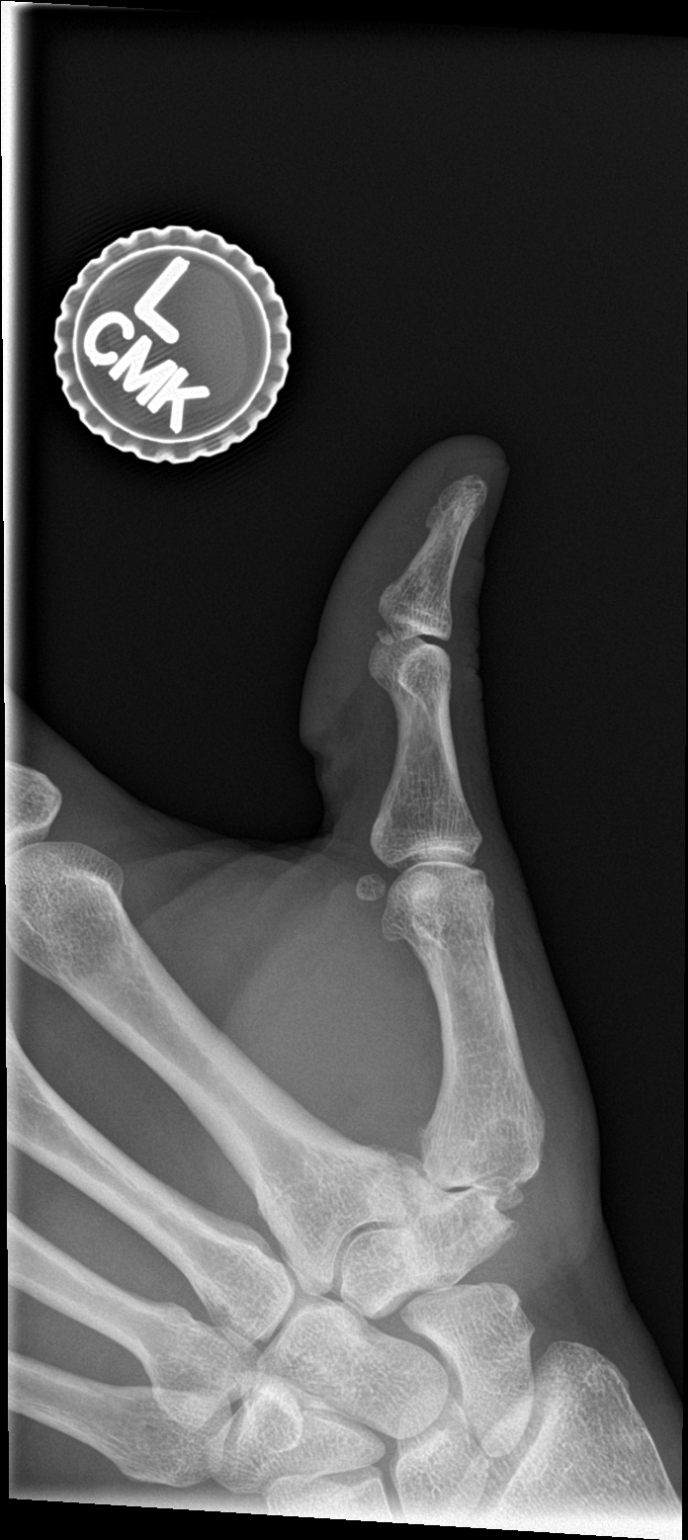

[finger obl]
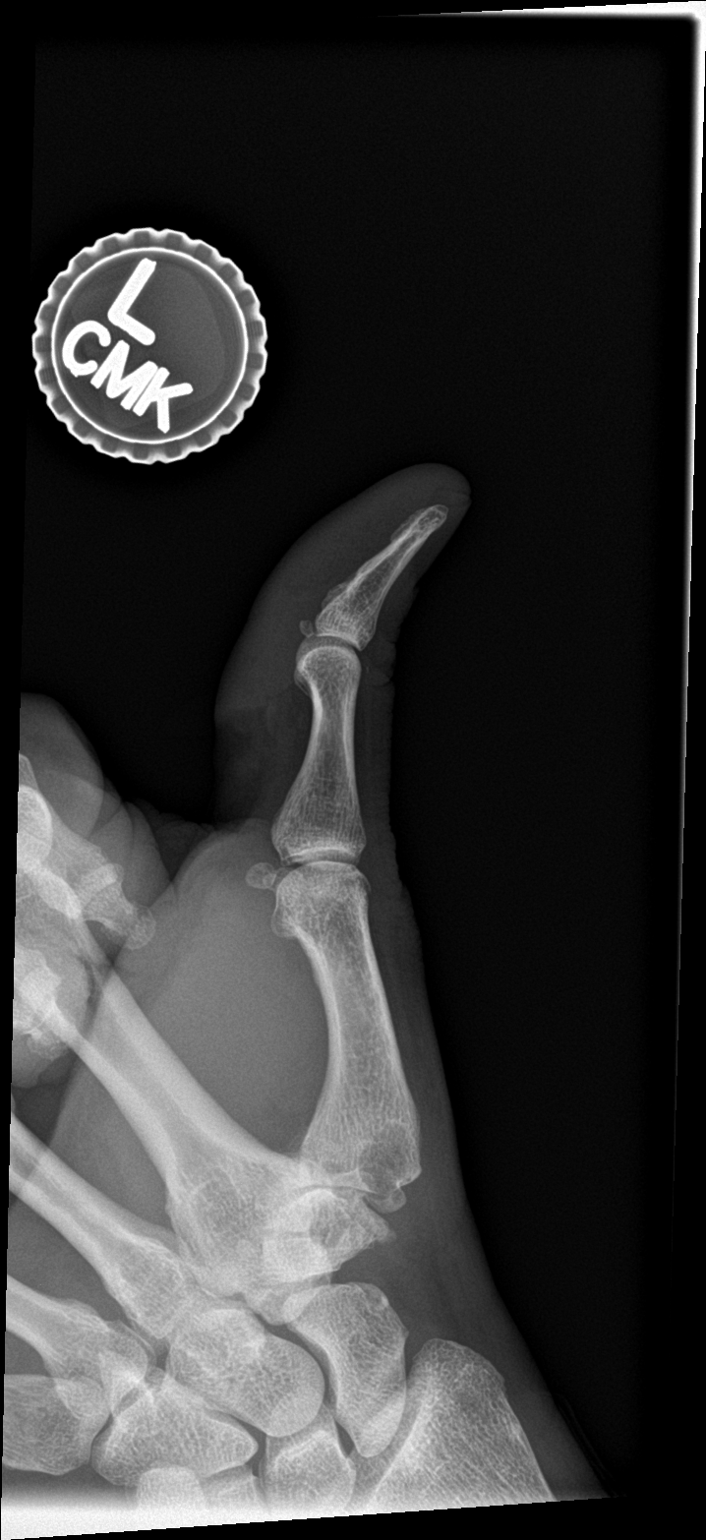

[finger lat]
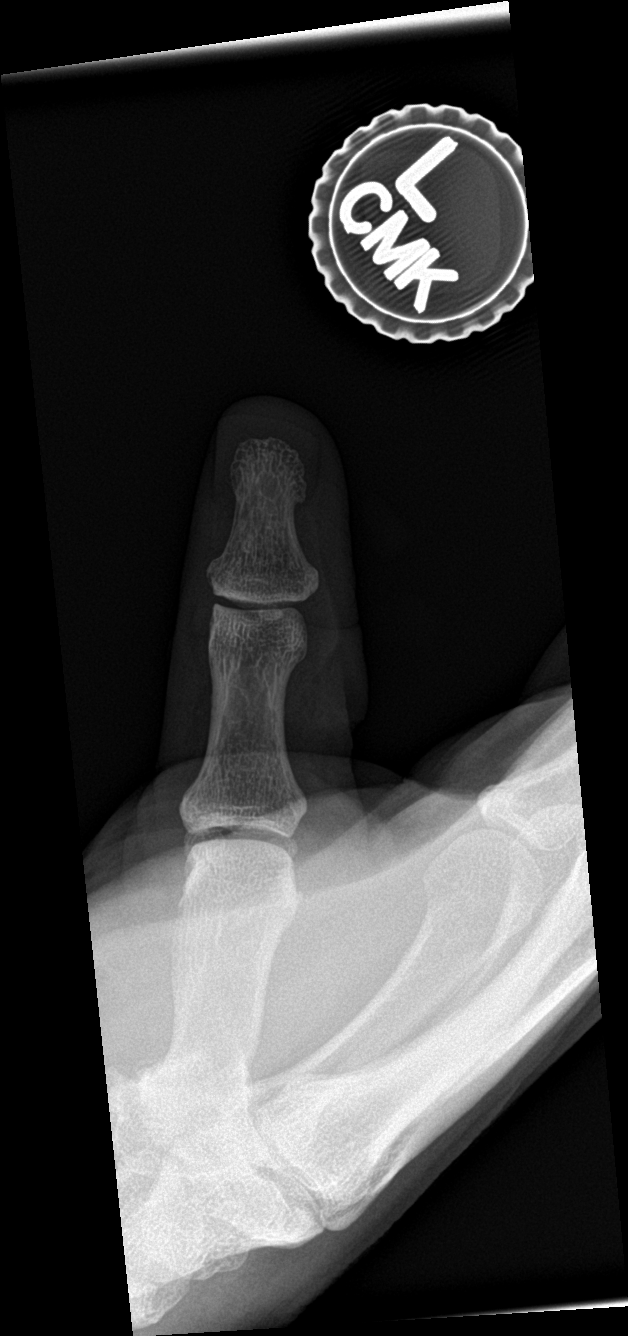

[3 of 3 positions shown; findings below may reference images not displayed]

FINDINGS: Osseous mineralization normal.

Soft tissue injury/defect at volar aspect proximal phalanx LEFT
thumb.

Degenerative changes first CMC joint with joint space narrowing,
spur formation, and large subchondral cyst at base of first
metacarpal.

No acute fracture, dislocation, or bone destruction.

No radiopaque foreign bodies.
IMPRESSION: No acute osseous abnormalities.

Degenerative changes LEFT first CMC joint.

## 2023-04-09 ENCOUNTER — Encounter: Payer: Self-pay | Admitting: Internal Medicine

## 2023-04-11 MED ORDER — ONDANSETRON HCL 4 MG PO TABS
4.0000 mg | ORAL_TABLET | Freq: Three times a day (TID) | ORAL | 0 refills | Status: AC | PRN
Start: 2023-04-11 — End: ?

## 2023-08-08 DIAGNOSIS — M2550 Pain in unspecified joint: Secondary | ICD-10-CM | POA: Diagnosis not present

## 2023-08-08 DIAGNOSIS — G479 Sleep disorder, unspecified: Secondary | ICD-10-CM | POA: Diagnosis not present

## 2023-08-08 DIAGNOSIS — E291 Testicular hypofunction: Secondary | ICD-10-CM | POA: Diagnosis not present

## 2023-08-08 DIAGNOSIS — Z6831 Body mass index (BMI) 31.0-31.9, adult: Secondary | ICD-10-CM | POA: Diagnosis not present

## 2023-08-12 ENCOUNTER — Encounter: Payer: Self-pay | Admitting: Internal Medicine

## 2023-08-18 ENCOUNTER — Other Ambulatory Visit: Payer: Self-pay | Admitting: Internal Medicine

## 2023-10-01 DIAGNOSIS — E039 Hypothyroidism, unspecified: Secondary | ICD-10-CM | POA: Diagnosis not present

## 2023-10-01 DIAGNOSIS — M2550 Pain in unspecified joint: Secondary | ICD-10-CM | POA: Diagnosis not present

## 2023-10-01 DIAGNOSIS — G479 Sleep disorder, unspecified: Secondary | ICD-10-CM | POA: Diagnosis not present

## 2023-10-01 DIAGNOSIS — E291 Testicular hypofunction: Secondary | ICD-10-CM | POA: Diagnosis not present

## 2023-10-01 DIAGNOSIS — Z6831 Body mass index (BMI) 31.0-31.9, adult: Secondary | ICD-10-CM | POA: Diagnosis not present

## 2023-10-13 ENCOUNTER — Telehealth: Payer: Self-pay | Admitting: *Deleted

## 2023-10-13 ENCOUNTER — Ambulatory Visit (INDEPENDENT_AMBULATORY_CARE_PROVIDER_SITE_OTHER): Admitting: *Deleted

## 2023-10-13 VITALS — BP 142/94 | HR 59 | Temp 98.6°F | Resp 16 | Ht 72.0 in | Wt 229.6 lb

## 2023-10-13 DIAGNOSIS — Z0000 Encounter for general adult medical examination without abnormal findings: Secondary | ICD-10-CM | POA: Diagnosis not present

## 2023-10-13 MED ORDER — ZALEPLON 10 MG PO CAPS: 10.0000 mg | ORAL_CAPSULE | Freq: Every evening | ORAL | 0 refills | Status: AC | PRN

## 2023-10-13 MED ORDER — FOSINOPRIL SODIUM 20 MG PO TABS: 40.0000 mg | ORAL_TABLET | Freq: Every day | ORAL | 0 refills | Status: DC

## 2023-10-13 MED ORDER — AMLODIPINE BESYLATE 5 MG PO TABS: 5.0000 mg | ORAL_TABLET | Freq: Every day | ORAL | 0 refills | Status: DC

## 2023-10-13 MED ORDER — ZALEPLON 10 MG PO CAPS: 10.0000 mg | ORAL_CAPSULE | Freq: Every evening | ORAL | 0 refills | Status: DC | PRN

## 2023-10-13 MED ORDER — TRAZODONE HCL 50 MG PO TABS: 50.0000 mg | ORAL_TABLET | Freq: Every evening | ORAL | 0 refills | Status: DC | PRN

## 2023-10-13 MED ORDER — CHLORTHALIDONE 25 MG PO TABS: 25.0000 mg | ORAL_TABLET | Freq: Every day | ORAL | 0 refills | Status: DC

## 2023-10-13 NOTE — Telephone Encounter (Signed)
 Pt had AWV today. He will call back to schedule CPE with PCP as he needs to check his work schedule first.  He is requesting a refill of Sonata  and I pended the request.  He reported that he is not going to take Lipitor and requested removal from his medication list.  Also, his BP was elevated both times I checked it today.  He says he has a history of this at the doctor and his readings are always good at home.

## 2023-10-13 NOTE — Patient Instructions (Addendum)
 Mr. Daniel Lyons , Thank you for taking time out of your busy schedule to complete your Annual Wellness Visit with me. I enjoyed our conversation and look forward to speaking with you again next year. I, as well as your care team,  appreciate your ongoing commitment to your health goals. Please review the following plan we discussed and let me know if I can assist you in the future. Your Game plan/ To Do List    Follow up Visits: Next Medicare AWV with our clinical staff:   10/13/24 8:20am  Next Office Visit with your provider: Please call as soon as you know your schedule and set up your physical with Dr Amon.  Clinician Recommendations:  Aim for 30 minutes of exercise or brisk walking, 6-8 glasses of water, and 5 servings of fruits and vegetables each day.       This is a list of the screening recommended for you and due dates:  Health Maintenance  Topic Date Due   Medicare Annual Wellness Visit  Never done   Pneumococcal Vaccine for age over 23 (1 of 1 - PCV) Never done   Zoster (Shingles) Vaccine (1 of 2) Never done   COVID-19 Vaccine (3 - 2024-25 season) 11/10/2022   Flu Shot  10/10/2023   DTaP/Tdap/Td vaccine (3 - Td or Tdap) 03/04/2031   Colon Cancer Screening  07/11/2032   Hepatitis C Screening  Completed   Hepatitis B Vaccine  Aged Out   HPV Vaccine  Aged Out   Meningitis B Vaccine  Aged Out    Advanced directives: (Copy Requested) Please bring a copy of your health care power of attorney and living will to the office to be added to your chart at your convenience. You can mail to [facility] [address] [place], [place] [address] or email to [email or web address] Advance Care Planning is important because it:  [x]  Makes sure you receive the medical care that is consistent with your values, goals, and preferences  [x]  It provides guidance to your family and loved ones and reduces their decisional burden about whether or not they are making the right decisions  based on your wishes.  Follow the link provided in your after visit summary or read over the paperwork we have mailed to you to help you started getting your Advance Directives in place. If you need assistance in completing these, please reach out to us  so that we can help you!  See attachments for Preventive Care and Fall Prevention Tips.

## 2023-10-13 NOTE — Telephone Encounter (Signed)
 Noted. PDMP okay, Rx sent

## 2023-10-13 NOTE — Progress Notes (Signed)
 Subjective:   Daniel Lyons is a 68 y.o. who presents for a Medicare Wellness preventive visit.  As a reminder, Annual Wellness Visits don't include a physical exam, and some assessments may be limited, especially if this visit is performed virtually. We may recommend an in-person follow-up visit with your provider if needed.  Visit Complete: In person  Persons Participating in Visit: Patient.  AWV Questionnaire: Yes: Patient Medicare AWV questionnaire was completed by the patient on 10/13/23; I have confirmed that all information answered by patient is correct and no changes since this date.  Cardiac Risk Factors include: advanced age (>94men, >45 women);male gender;hypertension;dyslipidemia;Other (see comment)     Objective:    Today's Vitals   10/13/23 0854 10/13/23 0921  BP: (!) 151/77 (!) 142/94  Pulse: (!) 59   Resp: 16   Temp: 98.6 F (37 C)   TempSrc: Oral   SpO2: 99%   Weight: 229 lb 9.6 oz (104.1 kg)   Height: 6' (1.829 m)    Body mass index is 31.14 kg/m.     10/13/2023    9:08 AM 03/03/2021    9:37 AM  Advanced Directives  Does Patient Have a Medical Advance Directive? Yes Yes  Type of Estate agent of [place];Living will Healthcare Power of [place];Living will  Does patient want to make changes to medical advance directive? No - Patient declined   Copy of Healthcare Power of Attorney in Chart? No - copy requested No - copy requested    Current Medications (verified) Outpatient Encounter Medications as of 10/13/2023  Medication Sig   albuterol  (VENTOLIN  HFA) 108 (90 Base) MCG/ACT inhaler Inhale 2 puffs into the lungs every 6 (six) hours as needed for wheezing or shortness of breath.   amLODipine  (NORVASC ) 5 MG tablet Take 1 tablet (5 mg total) by mouth daily.   aspirin EC 81 MG tablet Take 81 mg by mouth daily. Swallow whole.   chlorthalidone  (HYGROTON ) 25 MG tablet Take 1 tablet (25 mg total) by mouth daily.   fosinopril  (MONOPRIL ) 20  MG tablet Take 2 tablets (40 mg total) by mouth daily.   ondansetron  (ZOFRAN ) 4 MG tablet Take 1-2 tablets (4-8 mg total) by mouth every 8 (eight) hours as needed for nausea or vomiting.   traZODone  (DESYREL ) 50 MG tablet Take 1 tablet (50 mg total) by mouth at bedtime as needed for sleep.   [DISCONTINUED] amLODipine  (NORVASC ) 5 MG tablet Take 1 tablet (5 mg total) by mouth daily.   [DISCONTINUED] chlorthalidone  (HYGROTON ) 25 MG tablet Take 1 tablet (25 mg total) by mouth daily.   [DISCONTINUED] fosinopril  (MONOPRIL ) 20 MG tablet Take 2 tablets (40 mg total) by mouth daily.   [DISCONTINUED] traZODone  (DESYREL ) 50 MG tablet Take 1 tablet (50 mg total) by mouth at bedtime as needed for sleep.   [DISCONTINUED] zaleplon  (SONATA ) 10 MG capsule Take 1 capsule (10 mg total) by mouth at bedtime as needed for sleep.   [DISCONTINUED] zaleplon  (SONATA ) 10 MG capsule Take 1 capsule (10 mg total) by mouth at bedtime as needed for sleep.   [DISCONTINUED] atorvastatin  (LIPITOR) 80 MG tablet Take 1 tablet (80 mg total) by mouth at bedtime.   [DISCONTINUED] meloxicam  (MOBIC ) 15 MG tablet Take 1 tablet (15 mg total) by mouth daily.   No facility-administered encounter medications on file as of 10/13/2023.    Allergies (verified) Patient has no known allergies.   History: Past Medical History:  Diagnosis Date   Elevated LFTs  Giant cell tumor    removal   Heart murmur    frequent PVCs   Hypertension    Reactive airway disease    long ago   Past Surgical History:  Procedure Laterality Date   finger tumor removal     L index, giant cell tumor    thumb injury Left 02/2021   VASECTOMY  03/11/1992   Family History  Problem Relation Age of Onset   Prostate cancer Father 73   Pancreatic cancer Father    Prostate cancer Paternal Uncle    CAD Neg Hx    Diabetes Neg Hx    Colon cancer Neg Hx    Social History   Socioeconomic History   Marital status: Married    Spouse name: Not on file    Number of children: 3   Years of education: Not on file   Highest education level: Master's degree (e.g., MA, MS, MEng, MEd, MSW, MBA)  Occupational History   Occupation: Mining engineer  @ [employer]  Tobacco Use   Smoking status: Never   Smokeless tobacco: Never  Substance and Sexual Activity   Alcohol use: Yes    Comment: 12 oz wine 5 x weekly   Drug use: No   Sexual activity: Not on file  Other Topics Concern   Not on file  Social History Narrative   Household: pt and wife    Social Drivers of Corporate investment banker Strain: Low Risk  (10/13/2023)   Overall Financial Resource Strain (CARDIA)    Difficulty of Paying Living Expenses: Not hard at all  Food Insecurity: No Food Insecurity (10/13/2023)   Hunger Vital Sign    Worried About Running Out of Food in the Last Year: Never true    Ran Out of Food in the Last Year: Never true  Transportation Needs: No Transportation Needs (10/13/2023)   PRAPARE - Administrator, Civil Service (Medical): No    Lack of Transportation (Non-Medical): No  Physical Activity: Sufficiently Active (10/13/2023)   Exercise Vital Sign    Days of Exercise per Week: 7 days    Minutes of Exercise per Session: 50 min  Stress: No Stress Concern Present (10/13/2023)   [employer] of Occupational Health - Occupational Stress Questionnaire    Feeling of Stress: Only a little  Social Connections: Socially Integrated (10/13/2023)   Social Connection and Isolation Panel    Frequency of Communication with Friends and Family: More than three times a week    Frequency of Social Gatherings with Friends and Family: More than three times a week    Attends Religious Services: More than 4 times per year    Active Member of [employer] or Organizations: Yes    Attends Banker Meetings: 1 to 4 times per year    Marital Status: Married    Tobacco Counseling Counseling given: Not Answered    Clinical Intake:  Pre-visit preparation  completed: Yes  Pain : No/denies pain     BMI - recorded: 31.14 Nutritional Status: BMI > 30  Obese Nutritional Risks: None Diabetes: No  No results found for: HGBA1C   How often do you need to have someone help you when you read instructions, pamphlets, or other written materials from your doctor or pharmacy?: 1 - Never What is the last grade level you completed in school?: Master's degree  Interpreter Needed?: No  Information entered by :: Lolita Libra, CMA   Activities of Daily Living  10/13/2023    8:02 AM  In your present state of health, do you have any difficulty performing the following activities:  Hearing? 0  Vision? 0  Difficulty concentrating or making decisions? 0  Walking or climbing stairs? 0  Dressing or bathing? 0  Doing errands, shopping? 0  Preparing Food and eating ? N  Using the Toilet? N  In the past six months, have you accidently leaked urine? N  Do you have problems with loss of bowel control? N  Managing your Medications? N  Managing your Finances? N  Housekeeping or managing your Housekeeping? N    Patient Care Team: Amon Aloysius BRAVO, MD as PCP - General (Internal Medicine) Thukkani, Arun K, MD as PCP - Cardiology (Cardiology) Ladora Gunnar DEL., MD as Referring Physician (Gastroenterology)  I have updated your Care Teams any recent Medical Services you may have received from other providers in the past year.     Assessment:   This is a routine wellness examination for Syler.  Hearing/Vision screen Hearing Screening - Comments:: Denies hearing difficulties.  Vision Screening - Comments:: Last exam 2 years ago and all ok per pt.  He will reach out to schedule another exam.   Goals Addressed   None    Depression Screen     10/13/2023    9:06 AM 11/04/2022   10:35 AM 06/25/2021    1:48 PM 08/15/2020    4:37 PM 03/28/2020    3:39 PM 11/23/2018    1:42 PM 09/24/2017    9:59 AM  PHQ 2/9 Scores  PHQ - 2 Score 0 0 0 0 0 0 0  PHQ- 9 Score 1           Fall Risk     10/13/2023    9:08 AM 11/04/2022   10:35 AM 06/25/2021    1:49 PM 08/15/2020    4:37 PM 09/24/2017    9:59 AM  Fall Risk   Falls in the past year? 0 0 0 0 No   Number falls in past yr: 0 0 0 0   Injury with Fall? 0 0 0 0   Risk for fall due to : No Fall Risks      Follow up Falls evaluation completed Falls evaluation completed Falls evaluation completed  Falls evaluation completed       Data saved with a previous flowsheet row definition    MEDICARE RISK AT HOME:  Medicare Risk at Home Any stairs in or around the home?: (Patient-Rptd) Yes If so, are there any without handrails?: (Patient-Rptd) Yes Home free of loose throw rugs in walkways, pet beds, electrical cords, etc?: (Patient-Rptd) Yes Adequate lighting in your home to reduce risk of falls?: (Patient-Rptd) Yes Life alert?: (Patient-Rptd) No Use of a cane, walker or w/c?: (Patient-Rptd) No Grab bars in the bathroom?: (Patient-Rptd) No Shower chair or bench in shower?: (Patient-Rptd) No Elevated toilet seat or a handicapped toilet?: (Patient-Rptd) No  TIMED UP AND GO:  Was the test performed?  Yes  Length of time to ambulate 10 feet: 5 sec Gait steady and fast without use of assistive device  Cognitive Function: 6CIT completed        10/13/2023    9:09 AM  6CIT Screen  What Year? 0 points  What month? 0 points  What time? 0 points  Count back from 20 0 points  Months in reverse 0 points  Repeat phrase 0 points  Total Score 0 points    Immunizations Immunization History  Administered Date(s) Administered   Influenza Split 12/12/2015, 12/09/2021   Influenza,inj,Quad PF,6+ Mos 11/23/2018   Influenza-Unspecified 12/28/2014, 11/21/2016, 11/17/2017, 11/10/2019   Moderna Sars-Covid-2 Vaccination 01/26/2020   PFIZER(Purple Top)SARS-COV-2 Vaccination 05/28/2019   PPD Test 04/07/2012   Tdap 07/10/2014, 03/03/2021    Screening Tests Health Maintenance  Topic Date Due   Medicare Annual Wellness  (AWV)  Never done   INFLUENZA VACCINE  10/10/2023   Pneumococcal Vaccine: 50+ Years (1 of 1 - PCV) 10/12/2024 (Originally 04/21/2007)   COVID-19 Vaccine (3 - 2024-25 season) 10/12/2024 (Originally 11/10/2022)   Zoster Vaccines- Shingrix (1 of 2) 10/12/2024 (Originally 04/21/2007)   DTaP/Tdap/Td (3 - Td or Tdap) 03/04/2031   Colonoscopy  07/11/2032   Hepatitis C Screening  Completed   Hepatitis B Vaccines  Aged Out   HPV VACCINES  Aged Out   Meningococcal B Vaccine  Aged Out    Health Maintenance  Health Maintenance Due  Topic Date Due   Medicare Annual Wellness (AWV)  Never done   INFLUENZA VACCINE  10/10/2023   Health Maintenance Items Addressed: Pt declines: shingrix, pneumovax and COVID vaccines.  All other HM up to date.  Additional Screening:  Vision Screening: Recommended annual ophthalmology exams for early detection of glaucoma and other disorders of the eye. Would you like a referral to an eye doctor? No    Dental Screening: Recommended annual dental exams for proper oral hygiene  Community Resource Referral / Chronic Care Management: CRR required this visit?  No   CCM required this visit?  No   Plan:    I have personally reviewed and noted the following in the patient's chart:   Medical and social history Use of alcohol, tobacco or illicit drugs  Current medications and supplements including opioid prescriptions. Patient is not currently taking opioid prescriptions. Functional ability and status Nutritional status Physical activity Advanced directives List of other physicians Hospitalizations, surgeries, and ER visits in previous 12 months Vitals Screenings to include cognitive, depression, and falls Referrals and appointments  In addition, I have reviewed and discussed with patient certain preventive protocols, quality metrics, and best practice recommendations. A written personalized care plan for preventive services as well as general preventive health  recommendations were provided to patient.   Lolita Libra, CMA   10/13/2023   After Visit Summary: (In Person-Printed) AVS printed and given to the patient  Notes: see phone note

## 2023-10-31 ENCOUNTER — Ambulatory Visit (INDEPENDENT_AMBULATORY_CARE_PROVIDER_SITE_OTHER): Admitting: Internal Medicine

## 2023-10-31 ENCOUNTER — Encounter: Payer: Self-pay | Admitting: Internal Medicine

## 2023-10-31 VITALS — BP 130/80 | HR 79 | Temp 98.1°F | Resp 16 | Ht 72.0 in | Wt 231.1 lb

## 2023-10-31 DIAGNOSIS — I10 Essential (primary) hypertension: Secondary | ICD-10-CM | POA: Diagnosis not present

## 2023-10-31 DIAGNOSIS — I7121 Aneurysm of the ascending aorta, without rupture: Secondary | ICD-10-CM | POA: Diagnosis not present

## 2023-10-31 DIAGNOSIS — E785 Hyperlipidemia, unspecified: Secondary | ICD-10-CM | POA: Diagnosis not present

## 2023-10-31 DIAGNOSIS — G4709 Other insomnia: Secondary | ICD-10-CM

## 2023-10-31 DIAGNOSIS — R399 Unspecified symptoms and signs involving the genitourinary system: Secondary | ICD-10-CM | POA: Diagnosis not present

## 2023-10-31 DIAGNOSIS — C44619 Basal cell carcinoma of skin of left upper limb, including shoulder: Secondary | ICD-10-CM | POA: Insufficient documentation

## 2023-10-31 LAB — URINALYSIS, ROUTINE W REFLEX MICROSCOPIC
Bilirubin Urine: NEGATIVE
Hgb urine dipstick: NEGATIVE
Ketones, ur: NEGATIVE
Leukocytes,Ua: NEGATIVE
Nitrite: NEGATIVE
RBC / HPF: NONE SEEN (ref 0–?)
Specific Gravity, Urine: 1.01 (ref 1.000–1.030)
Total Protein, Urine: NEGATIVE
Urine Glucose: NEGATIVE
Urobilinogen, UA: 0.2 (ref 0.0–1.0)
WBC, UA: NONE SEEN (ref 0–?)
pH: 7 (ref 5.0–8.0)

## 2023-10-31 LAB — COMPREHENSIVE METABOLIC PANEL WITH GFR
ALT: 25 U/L (ref 0–53)
AST: 20 U/L (ref 0–37)
Albumin: 4.5 g/dL (ref 3.5–5.2)
Alkaline Phosphatase: 45 U/L (ref 39–117)
BUN: 15 mg/dL (ref 6–23)
CO2: 30 meq/L (ref 19–32)
Calcium: 9.1 mg/dL (ref 8.4–10.5)
Chloride: 99 meq/L (ref 96–112)
Creatinine, Ser: 0.88 mg/dL (ref 0.40–1.50)
GFR: 89.59 mL/min (ref >60.00)
Glucose, Bld: 110 mg/dL — ABNORMAL HIGH (ref 70–99)
Potassium: 4.7 meq/L (ref 3.5–5.1)
Sodium: 136 meq/L (ref 135–145)
Total Bilirubin: 0.5 mg/dL (ref 0.2–1.2)
Total Protein: 6.8 g/dL (ref 6.0–8.3)

## 2023-10-31 LAB — CBC WITH DIFFERENTIAL/PLATELET
Basophils Absolute: 0 K/uL (ref 0.0–0.1)
Basophils Relative: 0.8 % (ref 0.0–3.0)
Eosinophils Absolute: 0.2 K/uL (ref 0.0–0.7)
Eosinophils Relative: 4.5 % (ref 0.0–5.0)
HCT: 46.3 % (ref 39.0–52.0)
Hemoglobin: 15 g/dL (ref 13.0–17.0)
Lymphocytes Relative: 26.9 % (ref 12.0–46.0)
Lymphs Abs: 1 K/uL (ref 0.7–4.0)
MCHC: 32.5 g/dL (ref 30.0–36.0)
MCV: 92 fl (ref 78.0–100.0)
Monocytes Absolute: 0.5 K/uL (ref 0.1–1.0)
Monocytes Relative: 14.1 % — ABNORMAL HIGH (ref 3.0–12.0)
Neutro Abs: 2 K/uL (ref 1.4–7.7)
Neutrophils Relative %: 53.7 % (ref 43.0–77.0)
Platelets: 261 K/uL (ref 150.0–400.0)
RBC: 5.04 Mil/uL (ref 4.22–5.81)
RDW: 13.7 % (ref 11.5–15.5)
WBC: 3.7 K/uL — ABNORMAL LOW (ref 4.0–10.5)

## 2023-10-31 LAB — LIPID PANEL
Cholesterol: 189 mg/dL (ref 0–200)
HDL: 59 mg/dL (ref >39.00)
LDL Cholesterol: 120 mg/dL — ABNORMAL HIGH (ref 0–99)
NonHDL: 129.68
Total CHOL/HDL Ratio: 3
Triglycerides: 50 mg/dL (ref 0.0–149.0)
VLDL: 10 mg/dL (ref 0.0–40.0)

## 2023-10-31 LAB — PSA: PSA: 1.93 ng/mL (ref 0.10–4.00)

## 2023-10-31 LAB — TSH: TSH: 1.23 u[IU]/mL (ref 0.35–5.50)

## 2023-10-31 NOTE — Patient Instructions (Addendum)
 Vaccines are recommended Flu shot every fall Consider COVID booster PNM 20   Check the  blood pressure regularly Blood pressure goal:  between 110/65 and  135/85. If it is consistently higher or lower, let me know     GO TO THE LAB :  Get the blood work   Your results will be posted on MyChart with my comments  Go to the front desk for the checkout Please make an appointment for a physical exam in about a year

## 2023-10-31 NOTE — Progress Notes (Signed)
   Subjective:    Patient ID: Daniel Lyons, male    DOB: 01-Jul-1955, 68 y.o.   MRN: [id number]  DOS:  10/31/2023 Type of visit - description: Routine office visit  Feeling well.  Has no concerns.   he remains very active. No DOE, no chest pain.  No nausea or vomiting. No LUTS. Occasional insomnia.  Review of Systems See above   Past Medical History:  Diagnosis Date   Elevated LFTs    Giant cell tumor    removal   Heart murmur    frequent PVCs   Hypertension    Reactive airway disease    long ago    Past Surgical History:  Procedure Laterality Date   finger tumor removal     L index, giant cell tumor    thumb injury Left 02/2021   VASECTOMY  03/11/1992    Current Outpatient Medications  Medication Instructions   albuterol  (VENTOLIN  HFA) 108 (90 Base) MCG/ACT inhaler 2 puffs, Inhalation, Every 6 hours PRN   amLODipine  (NORVASC ) 5 mg, Oral, Daily   aspirin EC 81 mg, Daily   chlorthalidone  (HYGROTON ) 25 mg, Oral, Daily   fosinopril  (MONOPRIL ) 40 mg, Oral, Daily   ondansetron  (ZOFRAN ) 4-8 mg, Oral, Every 8 hours PRN   traZODone  (DESYREL ) 50 mg, Oral, At bedtime PRN   zaleplon  (SONATA ) 10 mg, Oral, At bedtime PRN       Objective:   Physical Exam BP 130/80   Pulse 79   Temp 98.1 F (36.7 C) (Oral)   Resp 16   Ht 6' (1.829 m)   Wt 231 lb 2 oz (104.8 kg)   SpO2 98%   BMI 31.35 kg/m  General: Well developed, NAD, BMI noted Neck: No  thyromegaly  HEENT:  Normocephalic . Face symmetric, atraumatic Lungs:  CTA B Normal respiratory effort, no intercostal retractions, no accessory muscle use. Heart: RRR, soft systolic murmur.  Abdomen:  Not distended, soft, non-tender. No rebound or rigidity.   Lower extremities: no pretibial edema bilaterally  Skin: Exposed areas without rash. Not pale. Not jaundice Neurologic:  alert & oriented X3.  Speech normal, gait appropriate for age and unassisted Strength symmetric and appropriate for age.  Psych: Cognition and  judgment appear intact.  Cooperative with normal attention span and concentration.  Behavior appropriate. No anxious or depressed appearing.     Assessment      Assessment HTN -on fosinopril  for a while, HCTZ change to chlorthalidone  03-2015. Hyperlipidemia: LDL goal less than 70 10/05/2018: - Calcium  coronary score elevated.  (1235) -Aortic sclerosis -Ascending aortic aneurysm: Needs regular CTAs.  Follow-up by cardiology. Echo: 05-2022: Mild aortic stenosis H/o RAD   H/o freq PVCs, dx remotely, holter in the 90s   PLAN Here for routine checkup. HTN: BP looks good, recommend ambulatory BPs, otherwise Continue amlodipine , chlorthalidone , Monopril . Dyslipidemia: Not on statins by choice, today states that he is very aware of benefits but has decided not to take them High coronary calcium  score, ascending aortic aneurysm, mild aortic stenosis: Very active, ASX, states he d/w cardiology, no need to continue CTs or f/u echo. Insomnia: On trazodone  most nights, occasionally takes Sonata . Preventive care reviewed: -Td 07/2020 - vaccines I recommend: Covid vax, PNM 20,   flu shot q fall.  Benefits discussed.  Pt will not pursue - CCS: Colonoscopy 2012 ; 07/2022 Cscope ,next per GI, no polyps per pt - FH Prostate ca: Occasional nocturia, check PSA UA urine culture RTC 1 year.

## 2023-11-01 LAB — URINE CULTURE
MICRO NUMBER:: 16870439
Result:: NO GROWTH
SPECIMEN QUALITY:: ADEQUATE

## 2023-11-02 ENCOUNTER — Encounter: Payer: Self-pay | Admitting: Internal Medicine

## 2023-11-02 NOTE — Assessment & Plan Note (Signed)
 Preventive care reviewed. -Td 07/2020 - vaccines I recommend: Covid vax, PNM 20,   flu shot q fall.  Benefits discussed.  Pt will not pursue - CCS: Colonoscopy 2012 ; 07/2022 Cscope ,next per GI, no polyps per pt - FH Prostate ca: Occasional nocturia, check PSA UA urine culture

## 2023-11-02 NOTE — Assessment & Plan Note (Signed)
 Here for routine checkup. HTN: BP looks good, recommend ambulatory BPs, otherwise Continue amlodipine , chlorthalidone , Monopril . Dyslipidemia: Not on statins by choice, today states that he is very aware of benefits but has decided not to take them High coronary calcium  score, ascending aortic aneurysm, mild aortic stenosis: Very active, ASX, states he d/w cardiology, no need to continue CTs or f/u echo. Insomnia: On trazodone  most nights, occasionally takes Sonata . Preventive care reviewed  RTC 1 year.

## 2023-11-05 ENCOUNTER — Ambulatory Visit: Payer: Self-pay | Admitting: Internal Medicine

## 2023-11-05 DIAGNOSIS — R972 Elevated prostate specific antigen [PSA]: Secondary | ICD-10-CM

## 2023-11-06 MED ORDER — CHLORTHALIDONE 25 MG PO TABS: 25.0000 mg | ORAL_TABLET | Freq: Every day | ORAL | 1 refills | Status: AC

## 2023-11-06 MED ORDER — TRAZODONE HCL 50 MG PO TABS: 50.0000 mg | ORAL_TABLET | Freq: Every evening | ORAL | 1 refills | Status: AC | PRN

## 2023-11-06 MED ORDER — FOSINOPRIL SODIUM 20 MG PO TABS: 40.0000 mg | ORAL_TABLET | Freq: Every day | ORAL | 1 refills | Status: AC

## 2023-11-06 MED ORDER — AMLODIPINE BESYLATE 5 MG PO TABS: 5.0000 mg | ORAL_TABLET | Freq: Every day | ORAL | 1 refills | Status: AC

## 2023-11-06 NOTE — Addendum Note (Signed)
 Addended by: Grey Schlauch D on: 11/06/2023 07:45 AM   Modules accepted: Orders

## 2024-01-06 DIAGNOSIS — E291 Testicular hypofunction: Secondary | ICD-10-CM | POA: Diagnosis not present

## 2024-01-06 DIAGNOSIS — Z79890 Hormone replacement therapy (postmenopausal): Secondary | ICD-10-CM | POA: Diagnosis not present

## 2024-01-06 DIAGNOSIS — E039 Hypothyroidism, unspecified: Secondary | ICD-10-CM | POA: Diagnosis not present

## 2024-01-08 DIAGNOSIS — M2550 Pain in unspecified joint: Secondary | ICD-10-CM | POA: Diagnosis not present

## 2024-01-08 DIAGNOSIS — Z6832 Body mass index (BMI) 32.0-32.9, adult: Secondary | ICD-10-CM | POA: Diagnosis not present

## 2024-01-08 DIAGNOSIS — E039 Hypothyroidism, unspecified: Secondary | ICD-10-CM | POA: Diagnosis not present

## 2024-01-08 DIAGNOSIS — G479 Sleep disorder, unspecified: Secondary | ICD-10-CM | POA: Diagnosis not present

## 2024-01-08 DIAGNOSIS — E291 Testicular hypofunction: Secondary | ICD-10-CM | POA: Diagnosis not present

## 2024-01-22 ENCOUNTER — Encounter: Payer: Self-pay | Admitting: Internal Medicine

## 2024-01-22 DIAGNOSIS — I7121 Aneurysm of the ascending aorta, without rupture: Secondary | ICD-10-CM

## 2024-02-02 ENCOUNTER — Ambulatory Visit (HOSPITAL_COMMUNITY): Admission: RE | Admit: 2024-02-02 | Source: Ambulatory Visit

## 2024-02-02 ENCOUNTER — Encounter (HOSPITAL_COMMUNITY): Payer: Self-pay

## 2024-03-15 ENCOUNTER — Telehealth

## 2024-03-15 DIAGNOSIS — J019 Acute sinusitis, unspecified: Secondary | ICD-10-CM | POA: Diagnosis not present

## 2024-03-15 DIAGNOSIS — B9689 Other specified bacterial agents as the cause of diseases classified elsewhere: Secondary | ICD-10-CM

## 2024-03-15 MED ORDER — AMOXICILLIN-POT CLAVULANATE 875-125 MG PO TABS: 1.0000 | ORAL_TABLET | Freq: Two times a day (BID) | ORAL | 0 refills | Status: AC

## 2024-03-15 NOTE — Progress Notes (Signed)
 " Virtual Visit Consent   Daniel Lyons, you are scheduled for a virtual visit with a [facility] provider today. Just as with appointments in the office, your consent must be obtained to participate. Your consent will be active for this visit and any virtual visit you may have with one of our providers in the next 365 days. If you have a MyChart account, a copy of this consent can be sent to you electronically.  As this is a virtual visit, video technology does not allow for your provider to perform a traditional examination. This may limit your provider's ability to fully assess your condition. If your provider identifies any concerns that need to be evaluated in person or the need to arrange testing (such as labs, EKG, etc.), we will make arrangements to do so. Although advances in technology are sophisticated, we cannot ensure that it will always work on either your end or our end. If the connection with a video visit is poor, the visit may have to be switched to a telephone visit. With either a video or telephone visit, we are not always able to ensure that we have a secure connection.  By engaging in this virtual visit, you consent to the provision of healthcare and authorize for your insurance to be billed (if applicable) for the services provided during this visit. Depending on your insurance coverage, you may receive a charge related to this service.  I need to obtain your verbal consent now. Are you willing to proceed with your visit today? Daniel Lyons has provided verbal consent on 03/15/2024 for a virtual visit (video or telephone). Jon CHRISTELLA Belt, NP  Date: 03/15/2024 8:51 AM   Virtual Visit via Video Note   I, Jon CHRISTELLA Belt, connected with  Daniel Lyons  ([id number], 03/09/1956) on 03/15/2024 at  8:45 AM EST by a video-enabled telemedicine application and verified that I am speaking with the correct person using two identifiers.  Location: Patient: Virtual Visit Location Patient:  Home Provider: Virtual Visit Location Provider: Home Office   I discussed the limitations of evaluation and management by telemedicine and the availability of in person appointments. The patient expressed understanding and agreed to proceed.    History of Present Illness: Daniel Lyons is a 69 y.o. who identifies as a male who was assigned male at birth, and is being seen today for possible sinus infection.   Cold since 12/28, was getting better, has been getting worse in last 4 days. No fever.   Headaches, post nasal drainage, is coughing up beige sputum. Frontal sinus pressure. Congestion   HPI: HPI  Problems:  Patient Active Problem List   Diagnosis Date Noted   BCC (basal cell carcinoma), arm, left 10/31/2023   Ascending aortic aneurysm 10/31/2023   Insomnia 09/24/2017   PCP NOTES >>>>>>>>>>>>>>>>>>>>>>>>> 05/03/2015   Annual physical exam 05/03/2015   Hypertension    Reactive airway disease     Allergies: Allergies[1] Medications: Current Medications[2]  Observations/Objective: Patient is well-developed, well-nourished in no acute distress.  Resting comfortably  at home.  Head is normocephalic, atraumatic.  No labored breathing.  Speech is clear and coherent with logical content.  Patient is alert and oriented at baseline.    Assessment and Plan: 1. Acute bacterial sinusitis (Primary) - amoxicillin -clavulanate (AUGMENTIN ) 875-125 MG tablet; Take 1 tablet by mouth 2 (two) times daily.  Dispense: 14 tablet; Refill: 0  Is taking allegra D. Ok to continue that. Add mucinex and saline spray or  irrigation.   Follow Up Instructions: I discussed the assessment and treatment plan with the patient. The patient was provided an opportunity to ask questions and all were answered. The patient agreed with the plan and demonstrated an understanding of the instructions.  A copy of instructions were sent to the patient via MyChart unless otherwise noted below.   The patient was  advised to call back or seek an in-person evaluation if the symptoms worsen or if the condition fails to improve as anticipated.    Jon CHRISTELLA Belt, NP    [1] No Known Allergies [2]  Current Outpatient Medications:    amoxicillin -clavulanate (AUGMENTIN ) 875-125 MG tablet, Take 1 tablet by mouth 2 (two) times daily., Disp: 14 tablet, Rfl: 0   albuterol  (VENTOLIN  HFA) 108 (90 Base) MCG/ACT inhaler, Inhale 2 puffs into the lungs every 6 (six) hours as needed for wheezing or shortness of breath., Disp: 18 g, Rfl: 5   amLODipine  (NORVASC ) 5 MG tablet, Take 1 tablet (5 mg total) by mouth daily., Disp: 90 tablet, Rfl: 1   aspirin EC 81 MG tablet, Take 81 mg by mouth daily. Swallow whole., Disp: , Rfl:    chlorthalidone  (HYGROTON ) 25 MG tablet, Take 1 tablet (25 mg total) by mouth daily., Disp: 90 tablet, Rfl: 1   fosinopril  (MONOPRIL ) 20 MG tablet, Take 2 tablets (40 mg total) by mouth daily., Disp: 180 tablet, Rfl: 1   ondansetron  (ZOFRAN ) 4 MG tablet, Take 1-2 tablets (4-8 mg total) by mouth every 8 (eight) hours as needed for nausea or vomiting., Disp: 21 tablet, Rfl: 0   traZODone  (DESYREL ) 50 MG tablet, Take 1 tablet (50 mg total) by mouth at bedtime as needed for sleep., Disp: 90 tablet, Rfl: 1   zaleplon  (SONATA ) 10 MG capsule, Take 1 capsule (10 mg total) by mouth at bedtime as needed for sleep., Disp: 90 capsule, Rfl: 0  "

## 2024-03-15 NOTE — Patient Instructions (Signed)
 " Daniel Lyons, thank you for joining Jon CHRISTELLA Belt, NP for today's virtual visit.  While this provider is not your primary care provider (PCP), if your PCP is located in our provider database this encounter information will be shared with them immediately following your visit.   A [facility] MyChart account gives you access to today's visit and all your visits, tests, and labs performed at [facility]  click here if you don't have a [facility] MyChart account or go to [email or web address]  Consent: (Patient) Daniel Lyons provided verbal consent for this virtual visit at the beginning of the encounter.  Current Medications:  Current Outpatient Medications:    amoxicillin -clavulanate (AUGMENTIN ) 875-125 MG tablet, Take 1 tablet by mouth 2 (two) times daily., Disp: 14 tablet, Rfl: 0   albuterol  (VENTOLIN  HFA) 108 (90 Base) MCG/ACT inhaler, Inhale 2 puffs into the lungs every 6 (six) hours as needed for wheezing or shortness of breath., Disp: 18 g, Rfl: 5   amLODipine  (NORVASC ) 5 MG tablet, Take 1 tablet (5 mg total) by mouth daily., Disp: 90 tablet, Rfl: 1   aspirin EC 81 MG tablet, Take 81 mg by mouth daily. Swallow whole., Disp: , Rfl:    chlorthalidone  (HYGROTON ) 25 MG tablet, Take 1 tablet (25 mg total) by mouth daily., Disp: 90 tablet, Rfl: 1   fosinopril  (MONOPRIL ) 20 MG tablet, Take 2 tablets (40 mg total) by mouth daily., Disp: 180 tablet, Rfl: 1   ondansetron  (ZOFRAN ) 4 MG tablet, Take 1-2 tablets (4-8 mg total) by mouth every 8 (eight) hours as needed for nausea or vomiting., Disp: 21 tablet, Rfl: 0   traZODone  (DESYREL ) 50 MG tablet, Take 1 tablet (50 mg total) by mouth at bedtime as needed for sleep., Disp: 90 tablet, Rfl: 1   zaleplon  (SONATA ) 10 MG capsule, Take 1 capsule (10 mg total) by mouth at bedtime as needed for sleep., Disp: 90 capsule, Rfl: 0   Medications ordered in this encounter:  Meds ordered this encounter  Medications   amoxicillin -clavulanate  (AUGMENTIN ) 875-125 MG tablet    Sig: Take 1 tablet by mouth 2 (two) times daily.    Dispense:  14 tablet    Refill:  0     *If you need refills on other medications prior to your next appointment, please contact your pharmacy*  Follow-Up: Call back or seek an in-person evaluation if the symptoms worsen or if the condition fails to improve as anticipated.  [facility] [phone]  Other Instructions  Try using saline nasal spray or try using saline irrigation, such as with a neti pot, several times a day while you are sick. Many neti pots come with salt packets premeasured to use to make saline. If you use your own salt, make sure it is kosher salt or sea salt (don't use table salt as it has iodine in it and you don't need that in your nose). Use distilled water to make saline. If you mix your own saline using your own salt, the recipe is 1/4 teaspoon salt in 1 cup warm water. Using saline irrigation can help prevent and treat sinus infections.   I also recommend adding Mucinex (ok to take generic guaifenesin)   If you have been instructed to have an in-person evaluation today at a local Urgent Care facility, please use the link below. It will take you to a list of all of our available [facility], including address, phone number and hours of  operation. Please do not delay care.  [facility]  If you or a family member do not have a primary care provider, use the link below to schedule a visit and establish care. When you choose a [facility] primary care physician or advanced practice provider, you gain a long-term partner in health. Find a Primary Care Provider  Learn more about [facility] in-office and virtual care options: [facility] Get Care Now  "

## 2024-03-19 ENCOUNTER — Encounter: Payer: Self-pay | Admitting: Internal Medicine

## 2024-03-19 MED ORDER — BENZONATATE 200 MG PO CAPS: 200.0000 mg | ORAL_CAPSULE | Freq: Three times a day (TID) | ORAL | 0 refills | Status: AC | PRN

## 2024-03-21 ENCOUNTER — Encounter: Payer: Self-pay | Admitting: Internal Medicine

## 2024-10-13 ENCOUNTER — Ambulatory Visit
